# Patient Record
Sex: Female | Born: 1950 | Race: White | Hispanic: No | Marital: Single | Smoking: Never smoker
Health system: Southern US, Community
[De-identification: ages and names within clinical notes are randomized; demographics above are authoritative.]

## PROBLEM LIST (undated history)

## (undated) DIAGNOSIS — K859 Acute pancreatitis without necrosis or infection, unspecified: Secondary | ICD-10-CM

## (undated) DIAGNOSIS — M1990 Unspecified osteoarthritis, unspecified site: Secondary | ICD-10-CM

## (undated) DIAGNOSIS — I10 Essential (primary) hypertension: Secondary | ICD-10-CM

---

## 1997-09-20 ENCOUNTER — Other Ambulatory Visit: Admission: RE | Admit: 1997-09-20 | Discharge: 1997-09-20 | Payer: Self-pay | Admitting: Obstetrics and Gynecology

## 1999-07-19 ENCOUNTER — Other Ambulatory Visit: Admission: RE | Admit: 1999-07-19 | Discharge: 1999-07-19 | Payer: Self-pay | Admitting: Obstetrics and Gynecology

## 2005-02-04 ENCOUNTER — Emergency Department (HOSPITAL_COMMUNITY): Admission: EM | Admit: 2005-02-04 | Discharge: 2005-02-04 | Payer: Self-pay | Admitting: Emergency Medicine

## 2011-02-26 DIAGNOSIS — K859 Acute pancreatitis without necrosis or infection, unspecified: Secondary | ICD-10-CM

## 2011-02-26 HISTORY — DX: Acute pancreatitis without necrosis or infection, unspecified: K85.90

## 2012-03-31 ENCOUNTER — Emergency Department (HOSPITAL_COMMUNITY): Payer: Self-pay

## 2012-03-31 ENCOUNTER — Emergency Department (HOSPITAL_COMMUNITY)
Admission: EM | Admit: 2012-03-31 | Discharge: 2012-03-31 | Disposition: A | Discharge status: Home / Self Care | Payer: Self-pay | Attending: Emergency Medicine | Admitting: Emergency Medicine

## 2012-03-31 ENCOUNTER — Encounter (HOSPITAL_COMMUNITY): Payer: Self-pay | Admitting: Neurology

## 2012-03-31 DIAGNOSIS — J40 Bronchitis, not specified as acute or chronic: Secondary | ICD-10-CM | POA: Insufficient documentation

## 2012-03-31 DIAGNOSIS — I10 Essential (primary) hypertension: Secondary | ICD-10-CM | POA: Insufficient documentation

## 2012-03-31 DIAGNOSIS — R5381 Other malaise: Secondary | ICD-10-CM | POA: Insufficient documentation

## 2012-03-31 DIAGNOSIS — R509 Fever, unspecified: Secondary | ICD-10-CM | POA: Insufficient documentation

## 2012-03-31 HISTORY — DX: Essential (primary) hypertension: I10

## 2012-03-31 MED ORDER — PREDNISONE 20 MG PO TABS
60.0000 mg | ORAL_TABLET | ORAL | Status: AC
  Administered 2012-03-31: 60 mg via ORAL
  Filled 2012-03-31: qty 3

## 2012-03-31 MED ORDER — HYDROCOD POLST-CHLORPHEN POLST 10-8 MG/5ML PO LQCR: 5.0000 mL | Freq: Once | ORAL | Status: DC

## 2012-03-31 MED ORDER — PREDNISONE 20 MG PO TABS: 60.0000 mg | ORAL_TABLET | Freq: Every day | ORAL | Status: AC

## 2012-03-31 NOTE — ED Provider Notes (Signed)
History     CSN: [id number]  Arrival date & time 03/31/12  0744   First MD Initiated Contact with Patient 03/31/12 [phone]      No chief complaint on file.    HPI  The patient presents with ongoing cough, subjective fever, fatigue.  Symptoms began approximately one week ago, without clear precipitant.  Since onset symptoms have been persistent, with some relief with ibuprofen.  She denies concurrent objective fever, confusion, disorientation, chest pain, vomiting, diarrhea. Symptoms include persistent hacking cough productive of sputum. The patient is homeless, but lives in a shelter currently. She does not smoke, does not drink.   Past Medical History  Diagnosis Date  . Hypertension     History reviewed. No pertinent past surgical history.  No family history on file.  History  Substance Use Topics  . Smoking status: Never Smoker   . Smokeless tobacco: Not on file  . Alcohol Use: Yes    OB History    Grav Para Term Preterm Abortions TAB SAB Ect Mult Living                  Review of Systems  Constitutional:       Per HPI, otherwise negative  HENT:       Per HPI, otherwise negative  Eyes: Negative.   Respiratory: Positive for cough. Negative for apnea, choking, chest tightness, shortness of breath and wheezing.   Cardiovascular:       Per HPI, otherwise negative  Gastrointestinal: Negative for nausea and vomiting.  Genitourinary: Negative.   Musculoskeletal:       Per HPI, otherwise negative  Skin: Negative.   Neurological: Negative for syncope.    Allergies  Lisinopril  Home Medications  No current outpatient prescriptions on file.  BP 158/81  Pulse 83  Temp 98.1 F (36.7 C) (Oral)  Resp 20  Ht 5' 3.5" (1.613 m)  Wt 195 lb (88.451 kg)  BMI 34.00 kg/m2  SpO2 96%  Physical Exam  Nursing note and vitals reviewed. Constitutional: She is oriented to person, place, and time. She appears well-developed and well-nourished. No distress.  HENT:  Head:  Normocephalic and atraumatic.  Eyes: Conjunctivae normal and EOM are normal.  Cardiovascular: Normal rate and regular rhythm.   Pulmonary/Chest: Effort normal and breath sounds normal. No stridor. No respiratory distress.  Abdominal: She exhibits no distension.  Musculoskeletal: She exhibits no edema.  Neurological: She is alert and oriented to person, place, and time. No cranial nerve deficit.  Skin: Skin is warm and dry.  Psychiatric: She has a normal mood and affect.    ED Course  Procedures (including critical care time)  Labs Reviewed - No data to display No results found.   No diagnosis found.  Update: Patient resting, seemingly comfortable. We discussed findings this far, the need for medication compliance, honey for antitussive.  The patient states that she has a clinic for followup.  MDM  This homeless female, living in a shelter now presents with ongoing cough.  Given her social situation, x-ray was performed to rule out pneumonia.  This was negative, but demonstrates bronchitis.  On exam the patient was in no distress.  She started on a short course of steroids.  Given a history of intolerance for antitussives, honey was advised.  She will follow up with clinic as needed.        Gerhard Munch, MD 03/31/12 [phone]

## 2012-03-31 NOTE — ED Notes (Signed)
Patient transported to X-ray 

## 2012-03-31 NOTE — ED Notes (Signed)
Pt states productive cough, fevers, lethargy. Feeling tired x 5 days.a & o x 4. Nad

## 2012-03-31 NOTE — ED Notes (Signed)
Pt unable to swallow pills well. Pt took 40 mg prednisone. Began gagging and coughed up 3rd 20 mg prednisone.

## 2012-04-06 ENCOUNTER — Encounter (HOSPITAL_COMMUNITY): Payer: Self-pay | Admitting: *Deleted

## 2012-04-06 ENCOUNTER — Emergency Department (HOSPITAL_COMMUNITY)
Admission: EM | Admit: 2012-04-06 | Discharge: 2012-04-06 | Disposition: A | Discharge status: Home / Self Care | Payer: Self-pay | Attending: Emergency Medicine | Admitting: Emergency Medicine

## 2012-04-06 ENCOUNTER — Emergency Department (HOSPITAL_COMMUNITY): Payer: Self-pay

## 2012-04-06 DIAGNOSIS — R05 Cough: Secondary | ICD-10-CM | POA: Insufficient documentation

## 2012-04-06 DIAGNOSIS — Z79899 Other long term (current) drug therapy: Secondary | ICD-10-CM | POA: Insufficient documentation

## 2012-04-06 DIAGNOSIS — I10 Essential (primary) hypertension: Secondary | ICD-10-CM | POA: Insufficient documentation

## 2012-04-06 DIAGNOSIS — IMO0002 Reserved for concepts with insufficient information to code with codable children: Secondary | ICD-10-CM | POA: Insufficient documentation

## 2012-04-06 DIAGNOSIS — J069 Acute upper respiratory infection, unspecified: Secondary | ICD-10-CM | POA: Insufficient documentation

## 2012-04-06 DIAGNOSIS — R059 Cough, unspecified: Secondary | ICD-10-CM | POA: Insufficient documentation

## 2012-04-06 MED ORDER — ALBUTEROL SULFATE HFA 108 (90 BASE) MCG/ACT IN AERS: INHALATION_SPRAY | RESPIRATORY_TRACT | Status: DC

## 2012-04-06 MED ORDER — BENZONATATE 100 MG PO CAPS: 100.0000 mg | ORAL_CAPSULE | Freq: Three times a day (TID) | ORAL | Status: DC | PRN

## 2012-04-06 NOTE — ED Notes (Signed)
Pt was seen here a week ago for cough, chill, congestion. Pt reports that even after taking medications prescribed here she does not feel much better. Pt reports continued cough and congestion.

## 2012-04-06 NOTE — ED Provider Notes (Signed)
History     CSN: [id number]  Arrival date & time 04/06/12  0740   First MD Initiated Contact with Patient 04/06/12 0745      Chief Complaint  Patient presents with  . URI     HPI Pt was seen at 0800.   Per pt, c/o gradual onset and persistence of constant ears congestion, runny/stuffy nose, sinus congestion, and cough for the past 2 weeks.  States cough is worse when she lays down at night.  Pt was eval in the ED 1 week ago for same, dx bronchitis, rx prednisone with partial relief of symptoms.  Denies fevers, no rash, no CP/SOB, no N/V/D, no abd pain.    Past Medical History  Diagnosis Date  . Hypertension     History reviewed. No pertinent past surgical history.    History  Substance Use Topics  . Smoking status: Never Smoker   . Smokeless tobacco: Not on file  . Alcohol Use: Yes    Review of Systems ROS: Statement: All systems negative except as marked or noted in the HPI; Constitutional: Negative for fever and chills. ; ; Eyes: Negative for eye pain, redness and discharge. ; ; ENMT: Negative for ear pain, hoarseness, sore throat. +ears congestion, nasal congestion, sinus pressure. ; ; Cardiovascular: Negative for chest pain, palpitations, diaphoresis, dyspnea and peripheral edema. ; ; Respiratory: +cough. Negative for wheezing and stridor. ; ; Gastrointestinal: Negative for nausea, vomiting, diarrhea, abdominal pain, blood in stool, hematemesis, jaundice and rectal bleeding. . ; ; Genitourinary: Negative for dysuria, flank pain and hematuria. ; ; Musculoskeletal: Negative for back pain and neck pain. Negative for swelling and trauma.; ; Skin: Negative for pruritus, rash, abrasions, blisters, bruising and skin lesion.; ; Neuro: Negative for headache, lightheadedness and neck stiffness. Negative for weakness, altered level of consciousness , altered mental status, extremity weakness, paresthesias, involuntary movement, seizure and syncope.       Allergies  Erythromycin;  Lisinopril; and Other  Home Medications   Current Outpatient Rx  Name  Route  Sig  Dispense  Refill  . hydrochlorothiazide (HYDRODIURIL) 25 MG tablet   Oral   Take 25 mg by mouth daily.         . predniSONE (DELTASONE) 20 MG tablet   Oral   Take 20 mg by mouth daily.         Marland Kitchen albuterol (PROVENTIL HFA;VENTOLIN HFA) 108 (90 BASE) MCG/ACT inhaler      2 to 4 puffs every 4 hours for the next 7 days, then as needed for cough, wheezing.   1 Inhaler   0   . benzonatate (TESSALON) 100 MG capsule   Oral   Take 1 capsule (100 mg total) by mouth 3 (three) times daily as needed for cough.   15 capsule   0     BP 145/70  Pulse 91  Temp(Src) 98.3 F (36.8 C) (Oral)  SpO2 97%  Physical Exam 0805: Physical examination:  Nursing notes reviewed; Vital signs and O2 SAT reviewed;  Constitutional: Well developed, Well nourished, Well hydrated, In no acute distress; Head:  Normocephalic, atraumatic; Eyes: EOMI, PERRL, No scleral icterus; ENMT: TM's clear bilat. +edemetous nasal turbinates bilat with clear rhinorrhea.  Mouth and pharynx normal, Mucous membranes moist; Neck: Supple, Full range of motion, No lymphadenopathy; Cardiovascular: Regular rate and rhythm, No murmur, rub, or gallop; Respiratory: Breath sounds clear & equal bilaterally, No rales, rhonchi, wheezes.  Speaking full sentences with ease, Normal respiratory effort/excursion; Chest: Nontender, Movement normal;  Abdomen: Soft, Nontender, Nondistended, Normal bowel sounds; Genitourinary: No CVA tenderness; Extremities: Pulses normal, No tenderness, No edema, No calf edema or asymmetry.; Neuro: AA&Ox3, Major CN grossly intact.  Speech clear. Climbs on and off stretcher with ease by herself. Gait steady. No gross focal motor or sensory deficits in extremities.; Skin: Color normal, Warm, Dry.   ED Course  Procedures    MDM  MDM Reviewed: nursing note, vitals and previous chart Interpretation: x-ray   Dg Chest 2  View 04/06/2012  *RADIOLOGY REPORT*  Clinical Data: Cough.  CHEST - 2 VIEW  Comparison: 03/31/2012  Findings: Heart size is at the upper limits of normal in stable. Somewhat low lung volumes are noted, however both lungs are clear. No evidence of pleural effusion.  No mass or lymphadenopathy identified.  The  IMPRESSION: Low lung volumes.  No active disease.   Original Report Authenticated By: Myles Rosenthal, M.D.     [phone]:  No pneumonia on CXR.  Will tx symptomatically at this time. Dx and testing d/w pt.  Questions answered.  Verb understanding, agreeable to d/c home with outpt f/u.      Laray Anger, DO 04/08/12 2043

## 2012-06-18 ENCOUNTER — Emergency Department (HOSPITAL_COMMUNITY)
Admission: EM | Admit: 2012-06-18 | Discharge: 2012-06-18 | Disposition: A | Discharge status: Home / Self Care | Payer: Self-pay | Attending: Emergency Medicine | Admitting: Emergency Medicine

## 2012-06-18 ENCOUNTER — Encounter (HOSPITAL_COMMUNITY): Payer: Self-pay | Admitting: Cardiology

## 2012-06-18 DIAGNOSIS — IMO0002 Reserved for concepts with insufficient information to code with codable children: Secondary | ICD-10-CM | POA: Insufficient documentation

## 2012-06-18 DIAGNOSIS — R5381 Other malaise: Secondary | ICD-10-CM | POA: Insufficient documentation

## 2012-06-18 DIAGNOSIS — N390 Urinary tract infection, site not specified: Secondary | ICD-10-CM

## 2012-06-18 DIAGNOSIS — Z8719 Personal history of other diseases of the digestive system: Secondary | ICD-10-CM | POA: Insufficient documentation

## 2012-06-18 DIAGNOSIS — Z79899 Other long term (current) drug therapy: Secondary | ICD-10-CM | POA: Insufficient documentation

## 2012-06-18 DIAGNOSIS — R0602 Shortness of breath: Secondary | ICD-10-CM | POA: Insufficient documentation

## 2012-06-18 DIAGNOSIS — E876 Hypokalemia: Secondary | ICD-10-CM

## 2012-06-18 DIAGNOSIS — I10 Essential (primary) hypertension: Secondary | ICD-10-CM | POA: Insufficient documentation

## 2012-06-18 DIAGNOSIS — R6883 Chills (without fever): Secondary | ICD-10-CM | POA: Insufficient documentation

## 2012-06-18 HISTORY — DX: Acute pancreatitis without necrosis or infection, unspecified: K85.90

## 2012-06-18 LAB — COMPREHENSIVE METABOLIC PANEL
Albumin: 2.9 g/dL — ABNORMAL LOW (ref 3.5–5.2)
Alkaline Phosphatase: 100 U/L (ref 39–117)
BUN: 10 mg/dL (ref 6–23)
Creatinine, Ser: 0.74 mg/dL (ref 0.50–1.10)
GFR calc Af Amer: >90 mL/min (ref >90)
Glucose, Bld: 135 mg/dL — ABNORMAL HIGH (ref 70–99)
Total Bilirubin: 0.6 mg/dL (ref 0.3–1.2)
Total Protein: 7.9 g/dL (ref 6.0–8.3)

## 2012-06-18 LAB — LIPASE, BLOOD: Lipase: 24 U/L (ref 11–59)

## 2012-06-18 LAB — URINALYSIS, ROUTINE W REFLEX MICROSCOPIC
Ketones, ur: 40 mg/dL — AB
Nitrite: POSITIVE — AB
Specific Gravity, Urine: 1.025 (ref 1.005–1.030)
Urobilinogen, UA: 2 mg/dL — ABNORMAL HIGH (ref 0.0–1.0)
pH: 6 (ref 5.0–8.0)

## 2012-06-18 LAB — CBC WITH DIFFERENTIAL/PLATELET
Basophils Relative: 0 % (ref 0–1)
Eosinophils Absolute: 0 10*3/uL (ref 0.0–0.7)
HCT: 41.2 % (ref 36.0–46.0)
Hemoglobin: 14.2 g/dL (ref 12.0–15.0)
Lymphs Abs: 1.6 10*3/uL (ref 0.7–4.0)
MCH: 28.3 pg (ref 26.0–34.0)
MCHC: 34.5 g/dL (ref 30.0–36.0)
MCV: 82.2 fL (ref 78.0–100.0)
Monocytes Absolute: 1.1 10*3/uL — ABNORMAL HIGH (ref 0.1–1.0)
Monocytes Relative: 11 % (ref 3–12)
RBC: 5.01 MIL/uL (ref 3.87–5.11)

## 2012-06-18 LAB — URINE MICROSCOPIC-ADD ON

## 2012-06-18 MED ORDER — SODIUM CHLORIDE 0.9 % IV SOLN
Freq: Once | INTRAVENOUS | Status: AC
  Administered 2012-06-18: 13:00:00 via INTRAVENOUS

## 2012-06-18 MED ORDER — POTASSIUM CHLORIDE CRYS ER 20 MEQ PO TBCR
40.0000 meq | EXTENDED_RELEASE_TABLET | Freq: Once | ORAL | Status: DC
  Filled 2012-06-18: qty 2

## 2012-06-18 MED ORDER — DEXTROSE 5 % IV SOLN
1.0000 g | Freq: Once | INTRAVENOUS | Status: AC
  Administered 2012-06-18: 1 g via INTRAVENOUS
  Filled 2012-06-18: qty 10

## 2012-06-18 MED ORDER — PROMETHAZINE HCL 12.5 MG PO TABS: 12.5000 mg | ORAL_TABLET | Freq: Four times a day (QID) | ORAL | Status: DC | PRN

## 2012-06-18 MED ORDER — SODIUM CHLORIDE 0.9 % IV BOLUS (SEPSIS)
500.0000 mL | Freq: Once | INTRAVENOUS | Status: AC
  Administered 2012-06-18: 500 mL via INTRAVENOUS

## 2012-06-18 MED ORDER — CEPHALEXIN 500 MG PO CAPS: 500.0000 mg | ORAL_CAPSULE | Freq: Two times a day (BID) | ORAL | Status: DC

## 2012-06-18 MED ORDER — ACETAMINOPHEN 160 MG/5ML PO SOLN
650.0000 mg | Freq: Four times a day (QID) | ORAL | Status: DC | PRN
  Administered 2012-06-18: 650 mg via ORAL
  Filled 2012-06-18: qty 20.3

## 2012-06-18 MED ORDER — ONDANSETRON HCL 4 MG/2ML IJ SOLN
4.0000 mg | Freq: Once | INTRAMUSCULAR | Status: AC
  Administered 2012-06-18: 4 mg via INTRAVENOUS
  Filled 2012-06-18: qty 2

## 2012-06-18 MED ORDER — POTASSIUM CHLORIDE 20 MEQ/15ML (10%) PO LIQD
40.0000 meq | Freq: Once | ORAL | Status: AC
  Administered 2012-06-18: 40 meq via ORAL
  Filled 2012-06-18: qty 30

## 2012-06-18 NOTE — ED Notes (Signed)
Pt reports intermittent nausea and malaise over the past couple of days. States she has not had much to eat or drink. States she is hungry but can not eat. Denies any diarrhea or urinary symptoms. Hx of pancreatitis

## 2012-06-18 NOTE — ED Provider Notes (Signed)
History     CSN: [id number]  Arrival date & time 06/18/12  1054   First MD Initiated Contact with Patient 06/18/12 1112      Chief Complaint  Patient presents with  . Nausea    (Consider location/radiation/quality/duration/timing/severity/associated sxs/prior treatment) HPI [name] is a 62 y.o. female who presents to ED with complaint of nausea. States symptoms began 2 days ago. States unable to eat due to nausea, eating or drinking makes it worse. Denies any abdominal pain or back pain. Denies any fever, admits to chills, no vomiting, diarrhea. No urinary symptoms. Has not tried anything to make symptoms better. Admits to shortness of breath and fatigue for same period of time.  Denies chest pain.    Past Medical History  Diagnosis Date  . Hypertension   . Pancreatitis     History reviewed. No pertinent past surgical history.  History reviewed. No pertinent family history.  History  Substance Use Topics  . Smoking status: Never Smoker   . Smokeless tobacco: Not on file  . Alcohol Use: Yes    OB History   Grav Para Term Preterm Abortions TAB SAB Ect Mult Living                  Review of Systems  Constitutional: Positive for chills and fatigue. Negative for fever.  HENT: Negative for neck pain and neck stiffness.   Respiratory: Positive for shortness of breath. Negative for cough and chest tightness.   Cardiovascular: Negative.   Gastrointestinal: Positive for nausea. Negative for vomiting, abdominal pain, diarrhea and blood in stool.  Genitourinary: Negative.   Musculoskeletal: Negative for myalgias.  Skin: Negative.   Neurological: Negative for weakness and numbness.  All other systems reviewed and are negative.    Allergies  Erythromycin; Lisinopril; and Other  Home Medications   Current Outpatient Rx  Name  Route  Sig  Dispense  Refill  . albuterol (PROVENTIL HFA;VENTOLIN HFA) 108 (90 BASE) MCG/ACT inhaler      2 to 4 puffs every 4 hours for  the next 7 days, then as needed for cough, wheezing.   1 Inhaler   0   . benzonatate (TESSALON) 100 MG capsule   Oral   Take 1 capsule (100 mg total) by mouth 3 (three) times daily as needed for cough.   15 capsule   0   . hydrochlorothiazide (HYDRODIURIL) 25 MG tablet   Oral   Take 25 mg by mouth daily.         . predniSONE (DELTASONE) 20 MG tablet   Oral   Take 20 mg by mouth daily.           BP 121/66  Pulse 86  Temp(Src) 98.3 F (36.8 C) (Oral)  Resp 24  SpO2 97%  Physical Exam  Nursing note and vitals reviewed. Constitutional: She is oriented to person, place, and time. She appears well-developed and well-nourished.  Uncomfortable appearing  HENT:  Head: Normocephalic.  Oral mucosa dry  Eyes: Conjunctivae are normal.  Neck: Neck supple.  Cardiovascular: Normal rate, regular rhythm and normal heart sounds.   Pulmonary/Chest: Effort normal and breath sounds normal. No respiratory distress. She has no wheezes. She has no rales.  Abdominal: Soft. Bowel sounds are normal. She exhibits no distension. There is no tenderness. There is no rebound and no guarding.  No CVA tenderness  Musculoskeletal: She exhibits no edema.  Neurological: She is alert and oriented to person, place, and time.  Skin: Skin  is warm and dry.  Psychiatric:  Pt anxious appearing    ED Course  Procedures (including critical care time)  Pt seen and examined. Pt with nausea, generalized malaise and weakness. Afebrile here. VS normal. Will get labs, UA, fluids started, zofran ordered. Pt is not in any pain.   Results for orders placed during the hospital encounter of 06/18/12  CBC WITH DIFFERENTIAL      Result Value Range   WBC 10.1  4.0 - 10.5 K/uL   RBC 5.01  3.87 - 5.11 MIL/uL   Hemoglobin 14.2  12.0 - 15.0 g/dL   HCT 16.1  09.6 - 04.5 %   MCV 82.2  78.0 - 100.0 fL   MCH 28.3  26.0 - 34.0 pg   MCHC 34.5  30.0 - 36.0 g/dL   RDW 40.9  81.1 - 91.4 %   Platelets 408 (*) 150 - 400 K/uL    Neutrophils Relative 73  43 - 77 %   Neutro Abs 7.3  1.7 - 7.7 K/uL   Lymphocytes Relative 16  12 - 46 %   Lymphs Abs 1.6  0.7 - 4.0 K/uL   Monocytes Relative 11  3 - 12 %   Monocytes Absolute 1.1 (*) 0.1 - 1.0 K/uL   Eosinophils Relative 0  0 - 5 %   Eosinophils Absolute 0.0  0.0 - 0.7 K/uL   Basophils Relative 0  0 - 1 %   Basophils Absolute 0.0  0.0 - 0.1 K/uL  COMPREHENSIVE METABOLIC PANEL      Result Value Range   Sodium 133 (*) 135 - 145 mEq/L   Potassium 3.2 (*) 3.5 - 5.1 mEq/L   Chloride 94 (*) 96 - 112 mEq/L   CO2 25  19 - 32 mEq/L   Glucose, Bld 135 (*) 70 - 99 mg/dL   BUN 10  6 - 23 mg/dL   Creatinine, Ser 7.82  0.50 - 1.10 mg/dL   Calcium 9.1  8.4 - 95.6 mg/dL   Total Protein 7.9  6.0 - 8.3 g/dL   Albumin 2.9 (*) 3.5 - 5.2 g/dL   AST 21  0 - 37 U/L   ALT 20  0 - 35 U/L   Alkaline Phosphatase 100  39 - 117 U/L   Total Bilirubin 0.6  0.3 - 1.2 mg/dL   GFR calc non Af Amer >90  >90 mL/min   GFR calc Af Amer >90  >90 mL/min  LIPASE, BLOOD      Result Value Range   Lipase 24  11 - 59 U/L  URINALYSIS, ROUTINE W REFLEX MICROSCOPIC      Result Value Range   Color, Urine YELLOW  YELLOW   APPearance HAZY (*) CLEAR   Specific Gravity, Urine 1.025  1.005 - 1.030   pH 6.0  5.0 - 8.0   Glucose, UA 100 (*) NEGATIVE mg/dL   Hgb urine dipstick SMALL (*) NEGATIVE   Bilirubin Urine MODERATE (*) NEGATIVE   Ketones, ur 40 (*) NEGATIVE mg/dL   Protein, ur 213 (*) NEGATIVE mg/dL   Urobilinogen, UA 2.0 (*) 0.0 - 1.0 mg/dL   Nitrite POSITIVE (*) NEGATIVE   Leukocytes, UA TRACE (*) NEGATIVE  URINE MICROSCOPIC-ADD ON      Result Value Range   Squamous Epithelial / LPF MANY (*) RARE   WBC, UA 0-2  <3 WBC/hpf   RBC / HPF 0-2  <3 RBC/hpf   Bacteria, UA MANY (*) RARE   Urine-Other LESS THAN 10 mL  OF URINE SUBMITTED      Date: 06/18/2012  Rate: 81  Rhythm: normal sinus rhythm  QRS Axis: normal  Intervals: normal  ST/T Wave abnormalities: normal  Conduction  Disutrbances:none  Narrative Interpretation:   Old EKG Reviewed: none available    2:17 PM Urine contaminated, however does appear infected due to positive nitrite and leukocytes, rocephin 1g IV given. She did spike a low grade fever of 100.3 orally, tylenol given for that. Labs unremarkable, except for mildly low sodium of 133, and potassium of 3.2. Potassium replaced with PO.  Her ECG is normal. Pt's nausea resolved. She is drinking PO fluids in ED. Suspect her symptoms most likely related to UTI vs early pyelonephritis.     1. UTI (lower urinary tract infection)   2. Hypokalemia       MDM  Pt with nausea, chills at home. No pain. No vomiting or diarrhea. No URI symptoms. No cough. No chest pain. Her lab work unremarkable. She was rehydrated with IV fluids. zofran given for nausea IV. She felt better. Nausea resolved. UA back showing contamination but most likely infected with positive nitrites and leukocyte esterase, many bacteria. Cultures sent. Treated in ED with 1g rocephin IV. Pt 's vs continued to be normal. No signs of pyelonephritis at this time, no evidence of sepsis. Home with tylenol for fever, keflex, phenergan for nausea.  Pt instructed to follow up closely with PCP or return if worsening.   Filed Vitals:   06/18/12 1108 06/18/12 1312 06/18/12 1415 06/18/12 1422  BP: 121/66 139/45 118/66   Pulse: 86 84 85   Temp: 98.3 F (36.8 C) 100.3 F (37.9 C)  99.4 F (37.4 C)  TempSrc: Oral Oral  Oral  Resp: 24 20 23    SpO2: 97% 100% 95%              Maaz Spiering A Kodah Maret, PA-C 06/18/12 1428

## 2012-06-19 LAB — URINE CULTURE: Colony Count: 50000

## 2012-06-19 NOTE — ED Provider Notes (Signed)
  Medical screening examination/treatment/procedure(s) were performed by non-physician practitioner and as supervising physician I was immediately available for consultation/collaboration.    Eshawn Coor D Rolonda Pontarelli, MD 06/19/12 0754 

## 2014-03-24 ENCOUNTER — Encounter (HOSPITAL_COMMUNITY): Payer: Self-pay | Admitting: Emergency Medicine

## 2014-03-24 ENCOUNTER — Emergency Department (HOSPITAL_COMMUNITY)
Admission: EM | Admit: 2014-03-24 | Discharge: 2014-03-24 | Disposition: A | Discharge status: Home / Self Care | Payer: Self-pay | Attending: Emergency Medicine | Admitting: Emergency Medicine

## 2014-03-24 DIAGNOSIS — Z760 Encounter for issue of repeat prescription: Secondary | ICD-10-CM

## 2014-03-24 DIAGNOSIS — Z8719 Personal history of other diseases of the digestive system: Secondary | ICD-10-CM | POA: Insufficient documentation

## 2014-03-24 DIAGNOSIS — I10 Essential (primary) hypertension: Secondary | ICD-10-CM | POA: Insufficient documentation

## 2014-03-24 DIAGNOSIS — Z792 Long term (current) use of antibiotics: Secondary | ICD-10-CM | POA: Insufficient documentation

## 2014-03-24 DIAGNOSIS — Z79899 Other long term (current) drug therapy: Secondary | ICD-10-CM | POA: Insufficient documentation

## 2014-03-24 MED ORDER — AMLODIPINE BESYLATE 5 MG PO TABS: 5.0000 mg | ORAL_TABLET | Freq: Every day | ORAL | Status: AC

## 2014-03-24 MED ORDER — HYDROCHLOROTHIAZIDE 25 MG PO TABS: 25.0000 mg | ORAL_TABLET | Freq: Every day | ORAL | Status: AC

## 2014-03-24 NOTE — Discharge Instructions (Signed)
Medication Refill, Emergency Department °We have refilled your medication today as a courtesy to you. It is best for your medical care, however, to take care of getting refills done through your primary caregiver's office. They have your records and can do a better job of follow-up than we can in the emergency department. °On maintenance medications, we often only prescribe enough medications to get you by until you are able to see your regular caregiver. This is a more expensive way to refill medications. °In the future, please plan for refills so that you will not have to use the emergency department for this. °Thank you for your help. Your help allows us to better take care of the daily emergencies that enter our department. °Document Released: 05/31/2003 Document Revised: 05/06/2011 Document Reviewed: 05/21/2013 °[employer]® Patient Information ©2015 [employer]. This information is not intended to replace advice given to you by your health care provider. Make sure you discuss any questions you have with your health care provider. ° °

## 2014-03-24 NOTE — ED Provider Notes (Signed)
CSN: [id number]     Arrival date & time 03/24/14  1414 History  This chart was scribed for non-physician practitioner Domenic Moras, working with Debby Freiberg, MD by Donato Schultz, ED Scribe. This patient was seen in room WTR7/WTR7 and the patient's care was started at 3:07 PM.   Chief Complaint  Patient presents with  . Medication Refill   The history is provided by the patient. No language interpreter was used.   HPI Comments: Doris White is a 64 y.o. female who presents to the Emergency Department to refill her blood pressure medication.  She normally has her prescriptions refilled at the [facility] but since they changed providers, they will no longer refill her medications.  The social worker referred her to the ED for a refill and is in the process of finding her another provider.  She takes 25mg  HCTZ and 5mg  Amlodipine QD.  She ran out of her medication yesterday and is anxious about her blood pressure becoming elevated.  She denies chest pain and SOB as associated symptoms.   Past Medical History  Diagnosis Date  . Hypertension   . Pancreatitis    History reviewed. No pertinent past surgical history. No family history on file. History  Substance Use Topics  . Smoking status: Never Smoker   . Smokeless tobacco: Not on file  . Alcohol Use: Yes   OB History    No data available     Review of Systems  Respiratory: Negative for shortness of breath.   Cardiovascular: Negative for chest pain.  All other systems reviewed and are negative.   Allergies  Erythromycin; Lisinopril; and Other  Home Medications   Prior to Admission medications   Medication Sig Start Date End Date Taking? Authorizing Provider  cephALEXin (KEFLEX) 500 MG capsule Take 1 capsule (500 mg total) by mouth 2 (two) times daily. 06/18/12   Tatyana A Kirichenko, PA-C  hydrochlorothiazide (HYDRODIURIL) 25 MG tablet Take 12.5 mg by mouth daily.     Historical Provider, MD  promethazine (PHENERGAN) 12.5 MG tablet  Take 1 tablet (12.5 mg total) by mouth every 6 (six) hours as needed for nausea. 06/18/12   Tatyana A Kirichenko, PA-C   Triage Vitals: BP 140/67 mmHg  Pulse 84  Temp(Src) 98.1 F (36.7 C) (Oral)  Resp 20  SpO2 97%  Physical Exam  Constitutional: She is oriented to person, place, and time. She appears well-developed and well-nourished.  HENT:  Head: Normocephalic and atraumatic.  Eyes: EOM are normal.  Neck: Normal range of motion.  Cardiovascular: Normal rate, regular rhythm and normal heart sounds.  Exam reveals no gallop and no friction rub.   No murmur heard. Pulmonary/Chest: Effort normal and breath sounds normal. No respiratory distress. She has no wheezes. She has no rales.  Musculoskeletal: Normal range of motion.  Neurological: She is alert and oriented to person, place, and time.  Skin: Skin is warm and dry.  Psychiatric: She has a normal mood and affect. Her behavior is normal.  Nursing note and vitals reviewed.   ED Course  Procedures (including critical care time)  DIAGNOSTIC STUDIES: Oxygen Saturation is 97% on room air, adequate by my interpretation.    COORDINATION OF CARE: 3:09 PM- Will refill the patient's blood pressure medication. The patient agreed to the treatment plan.    Labs Review Labs Reviewed - No data to display  Imaging Review No results found.   EKG Interpretation None      MDM   Final diagnoses:  Medication refill    BP 140/67 mmHg  Pulse 84  Temp(Src) 98.1 F (36.7 C) (Oral)  Resp 20  SpO2 97%   I personally performed the services described in this documentation, which was scribed in my presence. The recorded information has been reviewed and is accurate.     Domenic Moras, PA-C 03/24/14 [place], MD 03/25/14 [phone]

## 2014-03-24 NOTE — ED Notes (Signed)
Pt states that she was being seen at [facility] but since they have changed providers they will no longer refill her BP meds, she has been out since Tues/Wed.  Pt has gotten a Education officer, museum to help her find a new PCP, but told pt to come to ED to get her medications prescription and she will help pt get them filled.  Pt states that she is very anxious due to not being able to do.

## 2014-03-24 NOTE — Progress Notes (Signed)
CARE MANAGEMENT ED NOTE 03/24/2014  Patient:  BAYLEIGH, LOFLIN A   Account Number:  [id number]  Date Initiated:  03/24/2014  Documentation initiated by:  Jackelyn Poling  Subjective/Objective Assessment:   64 yr old self pay [place] pt was being seen at [facility] but since they have changed providers they will no longer refill her BP meds, she has been out since Tues/Wed.  Pt has gotten a Education officer, museum to help her find a new PCP, but told p     Subjective/Objective Assessment Detail:   pt to come to ED to get her medications prescription and she will help pt get them filled.  Pt states that she is very anxious due to not being able to do.     Pt told CM she was at [facility] on Tuesday  Pt confirmed her home address in [place]  This pt is not homeless.  States she was seen by Sharon Seller, NP at [facility] not a MD (pt gave Sharon Seller name from her last rx bottle  Pt told CM she was told to come to [facility] ED to get her medications for free.  Pt refused to attempt to allow CM to assist her with getting to another [facility] facility with a pharmacy to maybe honor her [facility] rx cost  Pt initially reluctant to give NP name at [facility] and to assist CM as Cm verified information in EPIC needed to place in PDMI system  Pt states she is attempting to get home before it gets dark and concerned because she states she has not had her bp med today "Scary" Pt moved from triage stretcher to bedside chair in room - anxious Pt raised the tone of her voice in throughout assessment then apologized to CM "for taking it out on you"  but voiced frustration on treatment of IRC "they never shared what you shared with me"  CM had informed pt that [facility] pt now assisted at [employer] and at [place] and pt may be able to get [facility] to assist her with an orange card that may help with a decreased cost for meds States she was only told she could not be assisted with meds at [facility] and [facility]  -eugene  Pt states she has $6 to get Rx filled but "may not have gas money later in week"     Action/Plan:   ED CM consulted by Triage RN for med assist to pt Stated pt "homeless" Spoke with pt discussed cost of hydrochlorothiazide $4 and Norvasc $7 at discounted pharmacies.  Assessed for homelessness. see notes below   Action/Plan Detail:   Anticipated DC Date:  03/24/2014     Status Recommendation to Physician:   Result of Recommendation:    Other ED Services  Consult Working [place]  Other  Outpatient Services - Pt will follow up  PCP issues  Medication Assistance  MATCH Program    Choice offered to / List presented to:            Status of service:  Completed, signed off  ED Comments:   ED Comments Detail:    CM reviewed EPIC notes and chart review information CM spoke with the pt about [facility] MATCH program ($3 co pay for each Rx through [facility] program, does not include refills, 7 day expiration of [place] letter and choice of pharmacies) Pt agreed to receive assistance from program  Pt is eligible for  [facility] MATCH program (unable to find pt listed in PDMI per cardholder name inquiry)  PDMI information entered. [place] letter completed and provided to pt.  CM updated ED RN  CM spoke with pt who confirms self pay [facility] resident with no pcp. CM discussed and provided written information for self pay pcps, importance of pcp for f/u care, www.needymeds.org, discounted pharmacies and other [employer] such as financial assistance, DSS and  health department  Reviewed resources for [employer] self pay pcps like Jinny Blossom, family medicine at [address], [facility] family practice, general medical clinics, [facility] urgent care plus others, [facility] out patient pharmacies, housing, and other resources in [employer]. Pt voiced understanding and appreciation of resources provided but initially stated she did not want resources CM encouraged pt again  to contact [facility] to see if she could get assistance with orange card and possible a facility to honor [facility] cost

## 2014-03-24 NOTE — ED Notes (Signed)
Waiting for case mgr

## 2014-03-24 NOTE — Progress Notes (Signed)
Dear _______Trudy A Jones__________________:  Dennis Bast have been approved to have the prescriptions written by your discharging physician filled through our [facility] (Medication Assistance Through [facility]) program. This program allows for a one-time (no refills) 34-day supply of selected medications for a low copay amount.  The copay is $3.00 per prescription. For instance, if you have one prescription, you will pay $3.00; for two prescriptions, you pay $6.00; for three prescriptions, you pay $9.00; and so on.  Only certain pharmacies are participating in this program with [facility]. You will need to select one of the pharmacies from the attached list and take your prescriptions, this letter, and your photo ID to one of the participating pharmacies.   We are excited that you are able to use the [facility] program to get your medications. These prescriptions must be filled within 7 days of hospital discharge or they will no longer be valid for the [facility] program. Should you have any problems with your prescriptions please contact your case management team member at [phone].  Thank you,   [place], [address] [place], [place], [place], [place], [place] and [place], [place], [place] [employer], [place], [employer], [place], [address] [employer], [place], [place], [place], [place], [place], [place], [place], [place], [place]   [facility] [address] [place], [place], [place], [place] [address] [place], [place], [place] [address], [place], [place], [place], [place], [place], [place] [address] [place], [place] [address], [place], [place], [place], [place], [place], [place] [address] [place], [place]   [place] [address] [place], [address] [employer] [address], [place], [place], [place], [place], [place], [place], [place]                                  [address], [place], [place], [place] [address], [place], [place], [place], [place], [place]  [place], [place], [place], [place] [address], [place], [place] [address], [place], [place] [employer], [place], [place] [address] [place], [place] [address] [place], [place]                                       [place], [place], [place] [employer], [place], [place]                                           [place], [place], [place], [place], [place], [place], [place], [place], [place], [place], [place], [place], [place]

## 2014-03-27 ENCOUNTER — Encounter (HOSPITAL_COMMUNITY): Payer: Self-pay | Admitting: Emergency Medicine

## 2014-03-27 ENCOUNTER — Emergency Department (HOSPITAL_COMMUNITY)
Admission: EM | Admit: 2014-03-27 | Discharge: 2014-03-27 | Disposition: A | Discharge status: Home / Self Care | Payer: Self-pay | Attending: Emergency Medicine | Admitting: Emergency Medicine

## 2014-03-27 DIAGNOSIS — I10 Essential (primary) hypertension: Secondary | ICD-10-CM | POA: Insufficient documentation

## 2014-03-27 DIAGNOSIS — X118XXA Contact with other hot tap-water, initial encounter: Secondary | ICD-10-CM | POA: Insufficient documentation

## 2014-03-27 DIAGNOSIS — Y9289 Other specified places as the place of occurrence of the external cause: Secondary | ICD-10-CM | POA: Insufficient documentation

## 2014-03-27 DIAGNOSIS — Y9389 Activity, other specified: Secondary | ICD-10-CM | POA: Insufficient documentation

## 2014-03-27 DIAGNOSIS — T23001A Burn of unspecified degree of right hand, unspecified site, initial encounter: Secondary | ICD-10-CM

## 2014-03-27 DIAGNOSIS — Y998 Other external cause status: Secondary | ICD-10-CM | POA: Insufficient documentation

## 2014-03-27 DIAGNOSIS — Z792 Long term (current) use of antibiotics: Secondary | ICD-10-CM | POA: Insufficient documentation

## 2014-03-27 DIAGNOSIS — Z8719 Personal history of other diseases of the digestive system: Secondary | ICD-10-CM | POA: Insufficient documentation

## 2014-03-27 DIAGNOSIS — T23201A Burn of second degree of right hand, unspecified site, initial encounter: Secondary | ICD-10-CM | POA: Insufficient documentation

## 2014-03-27 DIAGNOSIS — Z79899 Other long term (current) drug therapy: Secondary | ICD-10-CM | POA: Insufficient documentation

## 2014-03-27 DIAGNOSIS — T23031A Burn of unspecified degree of multiple right fingers (nail), not including thumb, initial encounter: Secondary | ICD-10-CM

## 2014-03-27 MED ORDER — HYDROCODONE-ACETAMINOPHEN 5-325 MG PO TABS: 2.0000 | ORAL_TABLET | ORAL | Status: DC | PRN

## 2014-03-27 MED ORDER — HYDROCODONE-ACETAMINOPHEN 5-325 MG PO TABS
2.0000 | ORAL_TABLET | Freq: Once | ORAL | Status: AC
  Administered 2014-03-27: 2 via ORAL
  Filled 2014-03-27: qty 2

## 2014-03-27 MED ORDER — SILVER SULFADIAZINE 1 % EX CREA
TOPICAL_CREAM | Freq: Two times a day (BID) | CUTANEOUS | Status: DC
  Administered 2014-03-27: 15:00:00 via TOPICAL
  Filled 2014-03-27: qty 50
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
## 2014-03-27 NOTE — Discharge Instructions (Signed)
Burn Care Your skin is a natural barrier to infection. It is the largest organ of your body. Burns damage this natural protection. To help prevent infection, it is very important to follow your caregiver's instructions in the care of your burn. Burns are classified as:  First degree. There is only redness of the skin (erythema). No scarring is expected.  Second degree. There is blistering of the skin. Scarring may occur with deeper burns.  Third degree. All layers of the skin are injured, and scarring is expected. HOME CARE INSTRUCTIONS   Wash your hands well before changing your bandage.  Change your bandage as often as directed by your caregiver.  Remove the old bandage. If the bandage sticks, you may soak it off with cool, clean water.  Cleanse the burn thoroughly but gently with mild soap and water.  Pat the area dry with a clean, dry cloth.  Apply a thin layer of antibacterial cream to the burn.  Apply a clean bandage as instructed by your caregiver.  Keep the bandage as clean and dry as possible.  Elevate the affected area for the first 24 hours, then as instructed by your caregiver.  Only take over-the-counter or prescription medicines for pain, discomfort, or fever as directed by your caregiver. SEEK IMMEDIATE MEDICAL CARE IF:   You develop excessive pain.  You develop redness, tenderness, swelling, or red streaks near the burn.  The burned area develops yellowish-white fluid (pus) or a bad smell.  You have a fever. MAKE SURE YOU:   Understand these instructions.  Will watch your condition.  Will get help right away if you are not doing well or get worse. Document Released: 02/11/2005 Document Revised: 05/06/2011 Document Reviewed: 07/04/2010 ExitCare Patient Information 2015 [employer]. This information is not intended to replace advice given to you by your health care provider. Make sure you discuss any questions you have with your health care  provider.  
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
## 2014-03-30 ENCOUNTER — Encounter (HOSPITAL_COMMUNITY): Payer: Self-pay | Admitting: Emergency Medicine

## 2014-03-30 ENCOUNTER — Emergency Department (INDEPENDENT_AMBULATORY_CARE_PROVIDER_SITE_OTHER)
Admission: EM | Admit: 2014-03-30 | Discharge: 2014-03-30 | Disposition: A | Discharge status: Home / Self Care | Payer: Self-pay | Source: Home / Self Care | Attending: Emergency Medicine | Admitting: Emergency Medicine

## 2014-03-30 DIAGNOSIS — IMO0002 Reserved for concepts with insufficient information to code with codable children: Secondary | ICD-10-CM

## 2014-03-30 DIAGNOSIS — T300 Burn of unspecified body region, unspecified degree: Secondary | ICD-10-CM

## 2014-03-30 MED ORDER — SILVER SULFADIAZINE 1 % EX CREA
1.0000 | TOPICAL_CREAM | Freq: Every day | CUTANEOUS | Status: DC
Start: 2014-03-30 — End: 2014-06-19

## 2014-03-30 MED ORDER — SILVER SULFADIAZINE 1 % EX CREA
TOPICAL_CREAM | CUTANEOUS | Status: AC
  Filled 2014-03-30: qty 85
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
## 2014-03-30 NOTE — Discharge Instructions (Signed)
Burn Care Your skin is a natural barrier to infection. It is the largest organ of your body. Burns damage this natural protection. To help prevent infection, it is very important to follow your caregiver's instructions in the care of your burn. Burns are classified as:  First degree. There is only redness of the skin (erythema). No scarring is expected.  Second degree. There is blistering of the skin. Scarring may occur with deeper burns.  Third degree. All layers of the skin are injured, and scarring is expected. HOME CARE INSTRUCTIONS   Wash your hands well before changing your bandage.  Change your bandage as often as directed by your caregiver.  Remove the old bandage. If the bandage sticks, you may soak it off with cool, clean water.  Cleanse the burn thoroughly but gently with mild soap and water.  Pat the area dry with a clean, dry cloth.  Apply a thin layer of antibacterial cream to the burn.  Apply a clean bandage as instructed by your caregiver.  Keep the bandage as clean and dry as possible.  Elevate the affected area for the first 24 hours, then as instructed by your caregiver.  Only take over-the-counter or prescription medicines for pain, discomfort, or fever as directed by your caregiver. SEEK IMMEDIATE MEDICAL CARE IF:   You develop excessive pain.  You develop redness, tenderness, swelling, or red streaks near the burn.  The burned area develops yellowish-white fluid (pus) or a bad smell.  You have a fever. MAKE SURE YOU:   Understand these instructions.  Will watch your condition.  Will get help right away if you are not doing well or get worse. Document Released: 02/11/2005 Document Revised: 05/06/2011 Document Reviewed: 07/04/2010 ExitCare Patient Information 2015 [employer]. This information is not intended to replace advice given to you by your health care provider. Make sure you discuss any questions you have with your health care  provider.  

## 2014-04-28 ENCOUNTER — Ambulatory Visit: Payer: Self-pay | Admitting: Internal Medicine

## 2014-05-10 ENCOUNTER — Ambulatory Visit: Payer: Self-pay

## 2014-06-19 ENCOUNTER — Emergency Department (HOSPITAL_COMMUNITY)
Admission: EM | Admit: 2014-06-19 | Discharge: 2014-06-19 | Disposition: A | Discharge status: Home / Self Care | Payer: Self-pay | Attending: Emergency Medicine | Admitting: Emergency Medicine

## 2014-06-19 ENCOUNTER — Encounter (HOSPITAL_COMMUNITY): Payer: Self-pay | Admitting: Emergency Medicine

## 2014-06-19 ENCOUNTER — Emergency Department (HOSPITAL_COMMUNITY): Payer: Self-pay

## 2014-06-19 DIAGNOSIS — I10 Essential (primary) hypertension: Secondary | ICD-10-CM | POA: Insufficient documentation

## 2014-06-19 DIAGNOSIS — Z79899 Other long term (current) drug therapy: Secondary | ICD-10-CM | POA: Insufficient documentation

## 2014-06-19 DIAGNOSIS — R059 Cough, unspecified: Secondary | ICD-10-CM

## 2014-06-19 DIAGNOSIS — E669 Obesity, unspecified: Secondary | ICD-10-CM | POA: Insufficient documentation

## 2014-06-19 DIAGNOSIS — R509 Fever, unspecified: Secondary | ICD-10-CM | POA: Insufficient documentation

## 2014-06-19 DIAGNOSIS — M5416 Radiculopathy, lumbar region: Secondary | ICD-10-CM | POA: Insufficient documentation

## 2014-06-19 DIAGNOSIS — R05 Cough: Secondary | ICD-10-CM | POA: Insufficient documentation

## 2014-06-19 DIAGNOSIS — Z8719 Personal history of other diseases of the digestive system: Secondary | ICD-10-CM | POA: Insufficient documentation

## 2014-06-19 LAB — I-STAT CHEM 8, ED
BUN: 9 mg/dL (ref 6–23)
CHLORIDE: 91 mmol/L — AB (ref 96–112)
Calcium, Ion: 1.06 mmol/L — ABNORMAL LOW (ref 1.13–1.30)
Creatinine, Ser: 0.7 mg/dL (ref 0.50–1.10)
Glucose, Bld: 118 mg/dL — ABNORMAL HIGH (ref 70–99)
HEMATOCRIT: 50 % — AB (ref 36.0–46.0)
Hemoglobin: 17 g/dL — ABNORMAL HIGH (ref 12.0–15.0)
Potassium: 2.8 mmol/L — ABNORMAL LOW (ref 3.5–5.1)
Sodium: 129 mmol/L — ABNORMAL LOW (ref 135–145)
TCO2: 23 mmol/L (ref 0–100)

## 2014-06-19 LAB — CBC WITH DIFFERENTIAL/PLATELET
BASOS PCT: 0 % (ref 0–1)
Basophils Absolute: 0 10*3/uL (ref 0.0–0.1)
Eosinophils Absolute: 0 10*3/uL (ref 0.0–0.7)
Eosinophils Relative: 0 % (ref 0–5)
HCT: 44.5 % (ref 36.0–46.0)
Hemoglobin: 15.4 g/dL — ABNORMAL HIGH (ref 12.0–15.0)
Lymphocytes Relative: 22 % (ref 12–46)
Lymphs Abs: 2.6 10*3/uL (ref 0.7–4.0)
MCH: 29.4 pg (ref 26.0–34.0)
MCHC: 34.6 g/dL (ref 30.0–36.0)
MCV: 84.9 fL (ref 78.0–100.0)
MONOS PCT: 9 % (ref 3–12)
Monocytes Absolute: 1 10*3/uL (ref 0.1–1.0)
NEUTROS ABS: 8 10*3/uL — AB (ref 1.7–7.7)
Neutrophils Relative %: 69 % (ref 43–77)
PLATELETS: 428 10*3/uL — AB (ref 150–400)
RBC: 5.24 MIL/uL — ABNORMAL HIGH (ref 3.87–5.11)
RDW: 13.1 % (ref 11.5–15.5)
WBC: 11.6 10*3/uL — ABNORMAL HIGH (ref 4.0–10.5)

## 2014-06-19 LAB — COMPREHENSIVE METABOLIC PANEL
ALBUMIN: 3.6 g/dL (ref 3.5–5.2)
ALT: 29 U/L (ref 0–35)
ANION GAP: 14 (ref 5–15)
AST: 26 U/L (ref 0–37)
Alkaline Phosphatase: 107 U/L (ref 39–117)
BUN: 11 mg/dL (ref 6–23)
CO2: 26 mmol/L (ref 19–32)
CREATININE: 0.71 mg/dL (ref 0.50–1.10)
Calcium: 8.8 mg/dL (ref 8.4–10.5)
Chloride: 89 mmol/L — ABNORMAL LOW (ref 96–112)
GFR calc Af Amer: >90 mL/min (ref >90)
GFR calc non Af Amer: >90 mL/min (ref >90)
Glucose, Bld: 115 mg/dL — ABNORMAL HIGH (ref 70–99)
Potassium: 2.8 mmol/L — ABNORMAL LOW (ref 3.5–5.1)
Sodium: 129 mmol/L — ABNORMAL LOW (ref 135–145)
TOTAL PROTEIN: 8.4 g/dL — AB (ref 6.0–8.3)
Total Bilirubin: 0.8 mg/dL (ref 0.3–1.2)

## 2014-06-19 LAB — URINALYSIS, ROUTINE W REFLEX MICROSCOPIC
Bilirubin Urine: NEGATIVE
GLUCOSE, UA: NEGATIVE mg/dL
HGB URINE DIPSTICK: NEGATIVE
Leukocytes, UA: NEGATIVE
Nitrite: NEGATIVE
PROTEIN: NEGATIVE mg/dL
Specific Gravity, Urine: 1.018 (ref 1.005–1.030)
Urobilinogen, UA: 1 mg/dL (ref 0.0–1.0)
pH: 8 (ref 5.0–8.0)

## 2014-06-19 LAB — RAPID HIV SCREEN (HIV 1/2 AB+AG)
HIV 1/2 ANTIBODIES: NONREACTIVE
HIV-1 P24 Antigen - HIV24: NONREACTIVE

## 2014-06-19 LAB — I-STAT CG4 LACTIC ACID, ED: Lactic Acid, Venous: 1.71 mmol/L (ref 0.5–2.0)

## 2014-06-19 LAB — LIPASE, BLOOD: LIPASE: 22 U/L (ref 11–59)

## 2014-06-19 MED ORDER — ONDANSETRON HCL 4 MG/2ML IJ SOLN
4.0000 mg | Freq: Once | INTRAMUSCULAR | Status: AC
  Administered 2014-06-19: 4 mg via INTRAVENOUS
  Filled 2014-06-19: qty 2

## 2014-06-19 MED ORDER — POTASSIUM CHLORIDE CRYS ER 20 MEQ PO TBCR
40.0000 meq | EXTENDED_RELEASE_TABLET | Freq: Once | ORAL | Status: AC
  Administered 2014-06-19: 10 meq via ORAL
  Filled 2014-06-19: qty 2

## 2014-06-19 MED ORDER — ACETAMINOPHEN 160 MG/5ML PO SOLN
650.0000 mg | Freq: Once | ORAL | Status: AC
  Administered 2014-06-19: 650 mg via ORAL
  Filled 2014-06-19: qty 20.3

## 2014-06-19 MED ORDER — IBUPROFEN 200 MG PO TABS
400.0000 mg | ORAL_TABLET | Freq: Once | ORAL | Status: AC
  Administered 2014-06-19: 400 mg via ORAL
  Filled 2014-06-19: qty 2

## 2014-06-19 MED ORDER — HYDROCODONE-ACETAMINOPHEN 5-325 MG PO TABS
1.0000 | ORAL_TABLET | Freq: Once | ORAL | Status: DC
  Filled 2014-06-19: qty 1

## 2014-06-19 MED ORDER — HYDROCODONE-ACETAMINOPHEN 5-325 MG PO TABS: ORAL_TABLET | ORAL | Status: DC

## 2014-06-19 MED ORDER — VANCOMYCIN HCL IN DEXTROSE 1-5 GM/200ML-% IV SOLN
1000.0000 mg | Freq: Once | INTRAVENOUS | Status: AC
  Administered 2014-06-19: 1000 mg via INTRAVENOUS
  Filled 2014-06-19: qty 200

## 2014-06-19 MED ORDER — PIPERACILLIN-TAZOBACTAM 3.375 G IVPB: 3.3750 g | Freq: Three times a day (TID) | INTRAVENOUS | Status: DC

## 2014-06-19 MED ORDER — ACETAMINOPHEN 325 MG PO TABS
650.0000 mg | ORAL_TABLET | Freq: Once | ORAL | Status: DC
  Filled 2014-06-19: qty 2

## 2014-06-19 MED ORDER — PIPERACILLIN-TAZOBACTAM 3.375 G IVPB 30 MIN
3.3750 g | Freq: Once | INTRAVENOUS | Status: AC
  Administered 2014-06-19: 3.375 g via INTRAVENOUS
  Filled 2014-06-19: qty 50

## 2014-06-19 MED ORDER — VANCOMYCIN HCL IN DEXTROSE 1-5 GM/200ML-% IV SOLN: 1000.0000 mg | Freq: Three times a day (TID) | INTRAVENOUS | Status: DC

## 2014-06-19 MED ORDER — SODIUM CHLORIDE 0.9 % IV BOLUS (SEPSIS)
1000.0000 mL | INTRAVENOUS | Status: AC
  Administered 2014-06-19 (×3): 1000 mL via INTRAVENOUS

## 2014-06-19 MED ORDER — PROMETHAZINE HCL 25 MG PO TABS: 25.0000 mg | ORAL_TABLET | Freq: Four times a day (QID) | ORAL | Status: DC | PRN

## 2014-06-19 MED ORDER — MORPHINE SULFATE 4 MG/ML IJ SOLN
4.0000 mg | Freq: Once | INTRAMUSCULAR | Status: AC
  Administered 2014-06-19: 4 mg via INTRAVENOUS
  Filled 2014-06-19: qty 1

## 2014-06-19 NOTE — ED Notes (Signed)
Provided patient a turkey sandwich and ginger ale.  

## 2014-06-19 NOTE — ED Notes (Signed)
Pt report aligns with EMS- also reports left buttock sciatic pain radiating down posterior thigh. Denies urinary frequency/malodorous urine/urinary changes. Denies being around anyone sick. Denies cough/congestion aside from seasonal allergies. Reports increased stress x3 years. Lying on right side, appears to be in distress. Reports constant nausea but has been drinking fluids. No other c/c.

## 2014-06-19 NOTE — ED Notes (Addendum)
EKG given to EDP,[place],MD., for review. 

## 2014-06-19 NOTE — ED Notes (Signed)
Per EMS- reports increased left knee pain, nausea when eating, and generalized weakness x3 weeks. Ambulatory with steady gait from stretcher to hospital bed. A&Ox4. VS: 150/70 HR 90 CBG 103 mg/dl.

## 2014-06-19 NOTE — ED Notes (Signed)
Bed: WA08 Expected date:  Expected time:  Means of arrival:  Comments: weakness 

## 2014-06-19 NOTE — ED Notes (Signed)
Pt is unable to take the complete dose of POTASSIUM PO TABLETS. Informed Doris White, [place], new order obtained.

## 2014-06-19 NOTE — ED Notes (Signed)
Patient would like to take the [facility] after eating.

## 2014-06-19 NOTE — ED Provider Notes (Signed)
CSN: [id number]     Arrival date & time 06/19/14  1813 History   First MD Initiated Contact with Patient 06/19/14 1819     Chief Complaint  Patient presents with  . Weakness  . Fever     (Consider location/radiation/quality/duration/timing/severity/associated sxs/prior Treatment) HPI    SERENITI WAN is a 64 y.o. female with past medical history significant for hypertension complaining of right gluteal pain rating down the posterior of the right leg which she's had in the past, however, this is more severe. Patient reports that she's been feeling generally fatigued with nausea having productive cough which she attributes to her seasonal allergies. Patient denies sore throat, diffuse myalgia, emesis, decreased water intake, decreased urinary output, dysuria, hematuria, headache, cervicalgia, rash, chest pain, shortness of breath, abdominal pain, melena, hematochezia, numbness, weakn history of IV drug use. She denies specifically low back pain. Patient states she had a similar episode in the past and was diagnosed with pancreatitis.  Past Medical History  Diagnosis Date  . Hypertension   . Pancreatitis    History reviewed. No pertinent past surgical history. Family History  Problem Relation Age of Onset  . Diabetes Mother   . Heart failure Father    History  Substance Use Topics  . Smoking status: Never Smoker   . Smokeless tobacco: Not on file  . Alcohol Use: Yes   OB History    No data available     Review of Systems  10 systems reviewed and found to be negative, except as noted in the HPI.   Allergies  Erythromycin; Lisinopril; and Other  Home Medications   Prior to Admission medications   Medication Sig Start Date End Date Taking? Authorizing Provider  amLODipine (NORVASC) 5 MG tablet Take 1 tablet (5 mg total) by mouth daily. 03/24/14  Yes Domenic Moras, PA-C  hydrochlorothiazide (HYDRODIURIL) 25 MG tablet Take 1 tablet (25 mg total) by mouth daily. 03/24/14  Yes Domenic Moras, PA-C  cephALEXin (KEFLEX) 500 MG capsule Take 1 capsule (500 mg total) by mouth 2 (two) times daily. Patient not taking: Reported on 03/27/2014 06/18/12   Jeannett Senior, PA-C  HYDROcodone-acetaminophen (NORCO/VICODIN) 5-325 MG per tablet Take 1-2 tablets by mouth every 6 hours as needed for pain and/or cough. 06/19/14   Umar Patmon, PA-C  promethazine (PHENERGAN) 25 MG tablet Take 1 tablet (25 mg total) by mouth every 6 (six) hours as needed for nausea or vomiting. 06/19/14   Elmyra Ricks Lyvia Mondesir, PA-C   BP 124/62 mmHg  Pulse 77  Temp(Src) 98.7 F (37.1 C) (Oral)  Resp 18  Wt 200 lb (90.719 kg)  SpO2 94% Physical Exam  Constitutional: She appears well-developed and well-nourished.  Obese, appears uncomfortable, flushed  HENT:  Head: Normocephalic.  Mouth/Throat: Oropharynx is clear and moist.  Dry MM  Eyes: Conjunctivae are normal. Pupils are equal, round, and reactive to light.  Neck: Normal range of motion. Neck supple.  FROM to C-spine. Pt can touch chin to chest without discomfort. No TTP of midline cervical spine.   Cardiovascular: Normal rate, regular rhythm and intact distal pulses.   Pulmonary/Chest: Effort normal and breath sounds normal. No respiratory distress. She has no wheezes. She has no rales. She exhibits no tenderness.  Abdominal: Soft. Bowel sounds are normal. She exhibits no distension and no mass. There is no tenderness. There is no rebound and no guarding.  Musculoskeletal: Normal range of motion.  Neurological: She is alert.  No point tenderness to percussion of lumbar spinal  processes.  No TTP or paraspinal muscular spasm. Strength is 5 out of 5 to bilateral lower extremities at hip and knee; extensor hallucis longus 5 out of 5. Ankle strength 5 out of 5, no clonus, neurovascularly intact. No saddle anaesthesia. Patellar reflexes are 2+ bilaterally.    Straight leg raise is negative bilaterally  Psychiatric: She has a normal mood and affect.  Nursing  note and vitals reviewed.   ED Course  Procedures (including critical care time) Labs Review Labs Reviewed  CBC WITH DIFFERENTIAL/PLATELET - Abnormal; Notable for the following:    WBC 11.6 (*)    RBC 5.24 (*)    Hemoglobin 15.4 (*)    Platelets 428 (*)    Neutro Abs 8.0 (*)    All other components within normal limits  COMPREHENSIVE METABOLIC PANEL - Abnormal; Notable for the following:    Sodium 129 (*)    Potassium 2.8 (*)    Chloride 89 (*)    Glucose, Bld 115 (*)    Total Protein 8.4 (*)    All other components within normal limits  URINALYSIS, ROUTINE W REFLEX MICROSCOPIC - Abnormal; Notable for the following:    APPearance CLOUDY (*)    Ketones, ur >80 (*)    All other components within normal limits  I-STAT CHEM 8, ED - Abnormal; Notable for the following:    Sodium 129 (*)    Potassium 2.8 (*)    Chloride 91 (*)    Glucose, Bld 118 (*)    Calcium, Ion 1.06 (*)    Hemoglobin 17.0 (*)    HCT 50.0 (*)    All other components within normal limits  CULTURE, BLOOD (ROUTINE X 2)  CULTURE, BLOOD (ROUTINE X 2)  URINE CULTURE  RAPID HIV SCREEN (HIV 1/2 AB+AG)  LIPASE, BLOOD  I-STAT CG4 LACTIC ACID, ED    Imaging Review Dg Chest Port 1 View  06/19/2014   CLINICAL DATA:  Weakness, fever  EXAM: PORTABLE CHEST - 1 VIEW  COMPARISON:  04/06/2012  FINDINGS: There are mild chronic bronchitic changes. There is no focal parenchymal opacity, pleural effusion, or pneumothorax. The heart and mediastinal contours are unremarkable.  The osseous structures are unremarkable.  IMPRESSION: No active disease.   Electronically Signed   By: Kathreen Devoid   On: 06/19/2014 19:41     EKG Interpretation   Date/Time:  Sunday June 19 2014 20:51:30 EDT Ventricular Rate:  77 PR Interval:  151 QRS Duration: 105 QT Interval:  443 QTC Calculation: 501 R Axis:   52 Text Interpretation:  Sinus rhythm Prolonged QT interval No significant  change since last tracing Confirmed by Zenia Resides  MD, ANTHONY  ([id number]) on  06/19/2014 10:41:19 PM      MDM   Final diagnoses:  Lumbar radiculopathy, acute  Fever, unspecified fever cause  Cough   Filed Vitals:   06/19/14 2015 06/19/14 2030 06/19/14 2049 06/19/14 2300  BP: 127/56 133/54 113/88 124/62  Pulse: 75 79 82 77  Temp:   99.2 F (37.3 C) 98.7 F (37.1 C)  TempSrc:   Oral Oral  Resp: 14 16 17 18   Weight:      SpO2: 96% 96% 94% 94%    Medications  sodium chloride 0.9 % bolus 1,000 mL (0 mLs Intravenous Stopped 06/19/14 2159)  piperacillin-tazobactam (ZOSYN) IVPB 3.375 g (0 g Intravenous Stopped 06/19/14 2029)  vancomycin (VANCOCIN) IVPB 1000 mg/200 mL premix (0 mg Intravenous Stopped 06/19/14 2159)  morphine 4 MG/ML injection 4 mg (4 mg  Intravenous Given 06/19/14 1928)  ondansetron (ZOFRAN) injection 4 mg (4 mg Intravenous Given 06/19/14 1928)  acetaminophen (TYLENOL) solution 650 mg (650 mg Oral Given 06/19/14 1946)  potassium chloride SA (K-DUR,KLOR-CON) CR tablet 40 mEq (10 mEq Oral Given 06/19/14 2035)  ibuprofen (ADVIL,MOTRIN) tablet 400 mg (400 mg Oral Given 06/19/14 2238)    Vonna Drafts is a pleasant 64 y.o. female presenting with left gluteal pain radiating down the left leg consistent with a lumbar radiculopathy. Patient is found to be febrile to 101.3. Initiated sepsis protocol with broad-spectrum antibiotics. She endorses cough which she attributes to her seasonal allergies, lung sounds are clear to auscultation, patient saturating adequately on room air. Chest x-rays without infiltrate. Patient is found to be hypokalemic at 2.8. She takes hydrochlorothiazide think that may be the culprit. Urinalysis is without signs of infection and it does show ketones of greater than 80. Patient states that she's had decreased by mouth intake. Patient is taking no medication over the last several days, decreased appetite is likely secondary to her fever. She has a normal lactic acid and days mild leukocytosis of 11.6. Discussed with patient the  importance of eating high potassium foods. Will refer her to the wellness Center. I think the fever is secondary to viral syndrome.  Discussed case with attending MD who agrees with plan and stability to d/c to home.   Evaluation does not show pathology that would require ongoing emergent intervention or inpatient treatment. Pt is hemodynamically stable and mentating appropriately. Discussed findings and plan with patient/guardian, who agrees with care plan. All questions answered. Return precautions discussed and outpatient follow up given.        Monico Blitz, PA-C 06/20/14 1572  Lacretia Leigh, MD 06/21/14 [phone]

## 2014-06-19 NOTE — Discharge Instructions (Signed)
For pain control please take ibuprofen (also known as Motrin or Advil) 800mg  (this is normally 4 over the counter pills) 3 times a day  for 5 days. Take with food to minimize stomach irritation.  Take vicodin for breakthrough pain, do not drink alcohol, drive, care for children or do other critical tasks while taking vicodin.  Push fluids: take small frequent sips of water or Gatorade, do not drink any soda, juice or caffeinated beverages.    Do not hesitate to return to the Emergency Department for any new, worsening or concerning symptoms.   If you do not have a primary care doctor you can establish one at the   [facility]: [place] [id number] [phone]  After you establish care. Let them know you were seen in the emergency room. They must obtain records for further management.

## 2014-06-19 NOTE — Progress Notes (Signed)
ANTIBIOTIC CONSULT NOTE - INITIAL  Pharmacy Consult for vancomycin and zosyn Indication: rule out sepsis  Allergies  Allergen Reactions  . Erythromycin Other (See Comments)    GI upset  . Lisinopril     Pancreatitis in past   . Other Other (See Comments)    Over the counter cough syrups.  Vomiting and diarrhea.    Patient Measurements: weight est 91 kg, height 63 inches   Vital Signs: Temp: 101.3 F (38.5 C) (04/24 1836) Temp Source: Oral (04/24 1836) BP: 152/85 mmHg (04/24 1836) Pulse Rate: 88 (04/24 1836) Intake/Output from previous day:   Intake/Output from this shift:    Labs: No results for input(s): WBC, HGB, PLT, LABCREA, CREATININE in the last 72 hours. CrCl cannot be calculated (Unknown ideal weight.). No results for input(s): VANCOTROUGH, VANCOPEAK, VANCORANDOM, GENTTROUGH, GENTPEAK, GENTRANDOM, TOBRATROUGH, TOBRAPEAK, TOBRARND, AMIKACINPEAK, AMIKACINTROU, AMIKACIN in the last 72 hours.   Microbiology: No results found for this or any previous visit (from the past 720 hour(s)).  Medical History: Past Medical History  Diagnosis Date  . Hypertension   . Pancreatitis     Medications:   see med rec   Assessment: Patient is a 64 y.o F presented to the ED with c/o knee pain and generalized weakness for the past few weeks.  She was found to have Tmax 101.  To start broad abx with vancomycin and zosyn for r/o sepsis.  Zosyn 3.375 gm IV x1 over 30 minutes given at 1943 in the ED.  Vancomycin 1gm IV x1 ordered by MD.  Goal of Therapy:  Vancomycin trough level 15-20 mcg/ml  Plan:  - zosyn 3.375 gm IV q8h (infuse over 4 hours) - vancomycin 1gm IV q8h  Doris White P 06/19/2014,6:59 PM

## 2014-06-21 LAB — URINE CULTURE
CULTURE: NO GROWTH
Colony Count: NO GROWTH

## 2014-06-26 ENCOUNTER — Encounter (HOSPITAL_COMMUNITY): Payer: Self-pay | Admitting: *Deleted

## 2014-06-26 ENCOUNTER — Emergency Department (HOSPITAL_COMMUNITY)
Admission: EM | Admit: 2014-06-26 | Discharge: 2014-06-26 | Disposition: A | Discharge status: Home / Self Care | Payer: Self-pay | Attending: Emergency Medicine | Admitting: Emergency Medicine

## 2014-06-26 DIAGNOSIS — M79605 Pain in left leg: Secondary | ICD-10-CM

## 2014-06-26 DIAGNOSIS — M545 Low back pain, unspecified: Secondary | ICD-10-CM

## 2014-06-26 DIAGNOSIS — Z792 Long term (current) use of antibiotics: Secondary | ICD-10-CM | POA: Insufficient documentation

## 2014-06-26 DIAGNOSIS — Z79899 Other long term (current) drug therapy: Secondary | ICD-10-CM | POA: Insufficient documentation

## 2014-06-26 DIAGNOSIS — Z8719 Personal history of other diseases of the digestive system: Secondary | ICD-10-CM | POA: Insufficient documentation

## 2014-06-26 DIAGNOSIS — I10 Essential (primary) hypertension: Secondary | ICD-10-CM | POA: Insufficient documentation

## 2014-06-26 LAB — CULTURE, BLOOD (ROUTINE X 2)
CULTURE: NO GROWTH
Culture: NO GROWTH

## 2014-06-26 MED ORDER — PREDNISONE 20 MG PO TABS: ORAL_TABLET | ORAL | Status: DC

## 2014-06-26 MED ORDER — METHOCARBAMOL 500 MG PO TABS: 500.0000 mg | ORAL_TABLET | Freq: Two times a day (BID) | ORAL | Status: DC

## 2014-06-26 MED ORDER — OXYCODONE-ACETAMINOPHEN 5-325 MG PO TABS: 2.0000 | ORAL_TABLET | ORAL | Status: DC | PRN

## 2014-06-26 NOTE — ED Provider Notes (Signed)
CSN: [id number]     Arrival date & time 06/26/14  1357 History  This chart was scribed for non-physician practitioner Doris Moras, PA, working with Nat Christen, MD, by Eustaquio Maize, ED Scribe. This patient was seen in room WTR5/WTR5 and the patient's care was started at 2:41 PM.    Chief Complaint  Patient presents with  . Sciatica   The history is provided by the patient. No language interpreter was used.     HPI Comments: Doris White is a 64 y.o. female who presents to the Emergency Department complaining of constant, gradually worsening, left sciatica pain that began 1 week ago. She states that the pain is located in her left buttock, radiating down to her left knee. Pt describes the pain as constant, severe, and throbbing in sensation. Pt mentions that she is having numbness to the left thigh, swelling to the left knee, and right knee pain. Pt is able to ambulate but is having difficulty due to the pain. She has been using a heating pad without relief. Denies recent injury or trauma to the area. She does report that she fell approximately 8 months ago, landing on knees, but did not have this specific pain until recently. She was seen in the ED 4/24 (1 week ago) for the same symptoms as well as a cough and fatigue. Pt was given IV medication while in the ED. She was also given a prescription for Hydrocodone.  She states that her viral symptoms have since improved but that she came back today for ongoing leg pain.Pt reports that she has been taking prescription strength Ibuprofen and Hydrocodone without relief. Denies urinary or bowel incontinence, saddle anesthesia, or any other symptoms. No hx IV drug abuse or cancer.    Past Medical History  Diagnosis Date  . Hypertension   . Pancreatitis    History reviewed. No pertinent past surgical history. Family History  Problem Relation Age of Onset  . Diabetes Mother   . Heart failure Father    History  Substance Use Topics  . Smoking status:  Never Smoker   . Smokeless tobacco: Not on file  . Alcohol Use: Yes   OB History    No data available     Review of Systems  Musculoskeletal: Positive for arthralgias (Left buttock pain radiating down to left knee. Right knee pain. ) and gait problem.  Neurological: Positive for numbness (To left thigh. ).      Allergies  Erythromycin; Lisinopril; and Other  Home Medications   Prior to Admission medications   Medication Sig Start Date End Date Taking? Authorizing Provider  amLODipine (NORVASC) 5 MG tablet Take 1 tablet (5 mg total) by mouth daily. 03/24/14  Yes Doris Moras, PA-C  hydrochlorothiazide (HYDRODIURIL) 25 MG tablet Take 1 tablet (25 mg total) by mouth daily. 03/24/14  Yes Doris Moras, PA-C  HYDROcodone-acetaminophen (NORCO/VICODIN) 5-325 MG per tablet Take 1-2 tablets by mouth every 6 hours as needed for pain and/or cough. 06/19/14  Yes Nicole Pisciotta, PA-C  ibuprofen (ADVIL,MOTRIN) 800 MG tablet Take 800 mg by mouth every 8 (eight) hours as needed for moderate pain.   Yes Historical Provider, MD  cephALEXin (KEFLEX) 500 MG capsule Take 1 capsule (500 mg total) by mouth 2 (two) times daily. Patient not taking: Reported on 03/27/2014 06/18/12   Jeannett Senior, PA-C  promethazine (PHENERGAN) 25 MG tablet Take 1 tablet (25 mg total) by mouth every 6 (six) hours as needed for nausea or vomiting. 06/19/14  Nicole Pisciotta, PA-C   Triage Vitals: BP 130/63 mmHg  Pulse 73  Temp(Src) 98.2 F (36.8 C) (Oral)  Resp 18  SpO2 98%   Physical Exam  Constitutional: She is oriented to person, place, and time. She appears well-developed and well-nourished. No distress.  HENT:  Head: Normocephalic and atraumatic.  Eyes: Conjunctivae and EOM are normal.  Neck: Neck supple. No tracheal deviation present.  Cardiovascular: Normal rate.   Pulmonary/Chest: Effort normal. No respiratory distress.  Musculoskeletal: Normal range of motion.  Tenderness to left sacral ilial region and left  thigh.  Patellar deep tendon reflex intact.  No foot drop.   Positive left straight leg raise.   Neurological: She is alert and oriented to person, place, and time.  Skin: Skin is warm and dry.  Psychiatric: She has a normal mood and affect. Her behavior is normal.  Nursing note and vitals reviewed.   ED Course  Procedures (including critical care time)  DIAGNOSTIC STUDIES: Oxygen Saturation is 98% on RA, normal by my interpretation.    COORDINATION OF CARE: 2:47 PM- Pt able to ambulate. Suspect pinched nerve. Discussed starting pt on course of steroids, muscle relaxer, and stronger pain medication. Advised pt to follow up with orthopedist if pain persists. Will give pt referral to orthopedist.   Labs Review Labs Reviewed - No data to display  Imaging Review No results found.   EKG Interpretation None      MDM   Final diagnoses:  Low back pain radiating to left leg   BP 130/63 mmHg  Pulse 73  Temp(Src) 98.2 F (36.8 C) (Oral)  Resp 18  SpO2 98%   I personally performed the services described in this documentation, which was scribed in my presence. The recorded information has been reviewed and is accurate.       Doris Moras, PA-C 06/26/14 1501  Nat Christen, MD 06/26/14 [phone]

## 2014-06-26 NOTE — Discharge Instructions (Signed)

## 2014-06-26 NOTE — ED Notes (Signed)
Pt complains of pain in her left buttock radiating down her left leg. Pt was seen in ED on 4/24 and given prescription for vicodin and ibuprofen. Pt states she is still in pain and is only able to walk short distances.

## 2014-07-04 ENCOUNTER — Ambulatory Visit: Payer: Self-pay | Attending: Family Medicine | Admitting: Family Medicine

## 2014-07-04 ENCOUNTER — Encounter: Payer: Self-pay | Admitting: Family Medicine

## 2014-07-04 VITALS — BP 118/76 | HR 80 | Temp 98.4°F | Resp 18 | Ht 63.0 in | Wt 251.0 lb

## 2014-07-04 DIAGNOSIS — M5442 Lumbago with sciatica, left side: Secondary | ICD-10-CM

## 2014-07-04 DIAGNOSIS — Z6841 Body Mass Index (BMI) 40.0 and over, adult: Secondary | ICD-10-CM | POA: Insufficient documentation

## 2014-07-04 DIAGNOSIS — I10 Essential (primary) hypertension: Secondary | ICD-10-CM

## 2014-07-04 MED ORDER — IBUPROFEN 600 MG PO TABS: 600.0000 mg | ORAL_TABLET | Freq: Three times a day (TID) | ORAL | Status: AC | PRN

## 2014-07-04 NOTE — Progress Notes (Signed)
   Subjective:    Patient ID: Doris White, female    DOB: 10-05-1950, 64 y.o.   MRN: [id number] CC:  HPI  1. BACK PAIN went to ED on 06/19/14 (given ibuprofen during the day and vicodin to take at night)  and 06/26/14 (given prednisone, percocet, robaxin)   Location: L hip pain Quality: sharp, throbbing Onset: 1 month ago  Worse with: walking, lying down  Better with: prednisone, pain medicine, sitting with a cushion Radiation: down to posterior leg  Trauma: no Best sitting/standing/leaning forward: sitting   Red Flags Fecal/urinary incontinence: no  Numbness/Weakness: no  Fever/chills/sweats: no   Night pain: yes  Unexplained weight loss: no  No relief with bedrest: yes  h/o cancer/immunosuppression: no  IV drug use: no  PMH of osteoporosis or chronic steroid use: no   Soc Hx: non smoker  Med Hx: HN  Surg Hx: negative   Review of Systems     Objective:   Physical Exam BP 118/76 mmHg  Pulse 80  Temp(Src) 98.4 F (36.9 C) (Oral)  Resp 18  Ht 5\' 3"  (1.6 m)  Wt 251 lb (113.853 kg)  BMI 44.47 kg/m2  SpO2 96% Wt Readings from Last 3 Encounters:  07/04/14 251 lb (113.853 kg)  06/19/14 200 lb (90.719 kg)  03/31/12 195 lb (88.451 kg)  General appearance: alert, cooperative and no distress Neck: no adenopathy, supple, symmetrical, trachea midline and thyroid not enlarged, symmetric, no tenderness/mass/nodules Lungs: clear to auscultation bilaterally Heart: regular rate and rhythm, S1, S2 normal, no murmur, click, rub or gallop  Back Exam: Back: Normal Curvature, no deformities or CVA tenderness  Paraspinal Tenderness: absent   LE Strength 5/5  LE Sensation: in tact  LE Reflexes 2+ and symmetric  Straight leg raise: negative b/l      Assessment & Plan:

## 2014-07-04 NOTE — Progress Notes (Signed)
Establish Care ED F/U inflammation of sciatic nerve

## 2014-07-04 NOTE — Patient Instructions (Addendum)
Doris White,  Thank you for coming in today. It was a pleasure meeting you. I look forward to being your primary doctor.  1. L hip pain with sciatica, improved. Take tart cherry juice which is a natural antiinflammatory Ibuprofen 600 mg if pain flares Robaxin muscle relaxer if pain flares Another course of prednisone if the above does not work   To prevent flares: Weight loss is recommended Also stretches, see below  F/u in 6 weeks for low back pain   Dr. Adrian Blackwater   Sciatica with Rehab The sciatic nerve runs from the back down the leg and is responsible for sensation and control of the muscles in the back (posterior) side of the thigh, lower leg, and foot. Sciatica is a condition that is characterized by inflammation of this nerve.  SYMPTOMS   Signs of nerve damage, including numbness and/or weakness along the posterior side of the lower extremity.  Pain in the back of the thigh that may also travel down the leg.  Pain that worsens when sitting for long periods of time.  Occasionally, pain in the back or buttock. CAUSES  Inflammation of the sciatic nerve is the cause of sciatica. The inflammation is due to something irritating the nerve. Common sources of irritation include:  Sitting for long periods of time.  Direct trauma to the nerve.  Arthritis of the spine.  Herniated or ruptured disk.  Slipping of the vertebrae (spondylolisthesis).  Pressure from soft tissues, such as muscles or ligament-like tissue (fascia). RISK INCREASES WITH:  Sports that place pressure or stress on the spine (football or weightlifting).  Poor strength and flexibility.  Failure to warm up properly before activity.  Family history of low back pain or disk disorders.  Previous back injury or surgery.  Poor body mechanics, especially when lifting, or poor posture. PREVENTION   Warm up and stretch properly before activity.  Maintain physical fitness:  Strength, flexibility, and  endurance.  Cardiovascular fitness.  Learn and use proper technique, especially with posture and lifting. When possible, have coach correct improper technique.  Avoid activities that place stress on the spine. PROGNOSIS If treated properly, then sciatica usually resolves within 6 weeks. However, occasionally surgery is necessary.  RELATED COMPLICATIONS   Permanent nerve damage, including pain, numbness, tingle, or weakness.  Chronic back pain.  Risks of surgery: infection, bleeding, nerve damage, or damage to surrounding tissues. TREATMENT Treatment initially involves resting from any activities that aggravate your symptoms. The use of ice and medication may help reduce pain and inflammation. The use of strengthening and stretching exercises may help reduce pain with activity. These exercises may be performed at home or with referral to a therapist. A therapist may recommend further treatments, such as transcutaneous electronic nerve stimulation (TENS) or ultrasound. Your caregiver may recommend corticosteroid injections to help reduce inflammation of the sciatic nerve. If symptoms persist despite non-surgical (conservative) treatment, then surgery may be recommended. MEDICATION  If pain medication is necessary, then nonsteroidal anti-inflammatory medications, such as aspirin and ibuprofen, or other minor pain relievers, such as acetaminophen, are often recommended.  Do not take pain medication for 7 days before surgery.  Prescription pain relievers may be given if deemed necessary by your caregiver. Use only as directed and only as much as you need.  Ointments applied to the skin may be helpful.  Corticosteroid injections may be given by your caregiver. These injections should be reserved for the most serious cases, because they may only be given a certain number  of times. HEAT AND COLD  Cold treatment (icing) relieves pain and reduces inflammation. Cold treatment should be applied  for 10 to 15 minutes every 2 to 3 hours for inflammation and pain and immediately after any activity that aggravates your symptoms. Use ice packs or massage the area with a piece of ice (ice massage).  Heat treatment may be used prior to performing the stretching and strengthening activities prescribed by your caregiver, physical therapist, or athletic trainer. Use a heat pack or soak the injury in warm water. SEEK MEDICAL CARE IF:  Treatment seems to offer no benefit, or the condition worsens.  Any medications produce adverse side effects. EXERCISES  RANGE OF MOTION (ROM) AND STRETCHING EXERCISES - Sciatica Most people with sciatic will find that their symptoms worsen with either excessive bending forward (flexion) or arching at the low back (extension). The exercises which will help resolve your symptoms will focus on the opposite motion. Your physician, physical therapist or athletic trainer will help you determine which exercises will be most helpful to resolve your low back pain. Do not complete any exercises without first consulting with your clinician. Discontinue any exercises which worsen your symptoms until you speak to your clinician. If you have pain, numbness or tingling which travels down into your buttocks, leg or foot, the goal of the therapy is for these symptoms to move closer to your back and eventually resolve. Occasionally, these leg symptoms will get better, but your low back pain may worsen; this is typically an indication of progress in your rehabilitation. Be certain to be very alert to any changes in your symptoms and the activities in which you participated in the 24 hours prior to the change. Sharing this information with your clinician will allow him/her to most efficiently treat your condition. These exercises may help you when beginning to rehabilitate your injury. Your symptoms may resolve with or without further involvement from your physician, physical therapist or  athletic trainer. While completing these exercises, remember:   Restoring tissue flexibility helps normal motion to return to the joints. This allows healthier, less painful movement and activity.  An effective stretch should be held for at least 30 seconds.  A stretch should never be painful. You should only feel a gentle lengthening or release in the stretched tissue. FLEXION RANGE OF MOTION AND STRETCHING EXERCISES: STRETCH - Flexion, Single Knee to Chest   Lie on a firm bed or floor with both legs extended in front of you.  Keeping one leg in contact with the floor, bring your opposite knee to your chest. Hold your leg in place by either grabbing behind your thigh or at your knee.  Pull until you feel a gentle stretch in your low back. Hold __________ seconds.  Slowly release your grasp and repeat the exercise with the opposite side. Repeat __________ times. Complete this exercise __________ times per day.  STRETCH - Flexion, Double Knee to Chest  Lie on a firm bed or floor with both legs extended in front of you.  Keeping one leg in contact with the floor, bring your opposite knee to your chest.  Tense your stomach muscles to support your back and then lift your other knee to your chest. Hold your legs in place by either grabbing behind your thighs or at your knees.  Pull both knees toward your chest until you feel a gentle stretch in your low back. Hold __________ seconds.  Tense your stomach muscles and slowly return one leg at a  time to the floor. Repeat __________ times. Complete this exercise __________ times per day.  STRETCH - Low Trunk Rotation   Lie on a firm bed or floor. Keeping your legs in front of you, bend your knees so they are both pointed toward the ceiling and your feet are flat on the floor.  Extend your arms out to the side. This will stabilize your upper body by keeping your shoulders in contact with the floor.  Gently and slowly drop both knees together  to one side until you feel a gentle stretch in your low back. Hold for __________ seconds.  Tense your stomach muscles to support your low back as you bring your knees back to the starting position. Repeat the exercise to the other side. Repeat __________ times. Complete this exercise __________ times per day  EXTENSION RANGE OF MOTION AND FLEXIBILITY EXERCISES: STRETCH - Extension, Prone on Elbows  Lie on your stomach on the floor, a bed will be too soft. Place your palms about shoulder width apart and at the height of your head.  Place your elbows under your shoulders. If this is too painful, stack pillows under your chest.  Allow your body to relax so that your hips drop lower and make contact more completely with the floor.  Hold this position for __________ seconds.  Slowly return to lying flat on the floor. Repeat __________ times. Complete this exercise __________ times per day.  RANGE OF MOTION - Extension, Prone Press Ups  Lie on your stomach on the floor, a bed will be too soft. Place your palms about shoulder width apart and at the height of your head.  Keeping your back as relaxed as possible, slowly straighten your elbows while keeping your hips on the floor. You may adjust the placement of your hands to maximize your comfort. As you gain motion, your hands will come more underneath your shoulders.  Hold this position __________ seconds.  Slowly return to lying flat on the floor. Repeat __________ times. Complete this exercise __________ times per day.  STRENGTHENING EXERCISES - Sciatica  These exercises may help you when beginning to rehabilitate your injury. These exercises should be done near your "sweet spot." This is the neutral, low-back arch, somewhere between fully rounded and fully arched, that is your least painful position. When performed in this safe range of motion, these exercises can be used for people who have either a flexion or extension based injury. These  exercises may resolve your symptoms with or without further involvement from your physician, physical therapist or athletic trainer. While completing these exercises, remember:   Muscles can gain both the endurance and the strength needed for everyday activities through controlled exercises.  Complete these exercises as instructed by your physician, physical therapist or athletic trainer. Progress with the resistance and repetition exercises only as your caregiver advises.  You may experience muscle soreness or fatigue, but the pain or discomfort you are trying to eliminate should never worsen during these exercises. If this pain does worsen, stop and make certain you are following the directions exactly. If the pain is still present after adjustments, discontinue the exercise until you can discuss the trouble with your clinician. STRENGTHENING - Deep Abdominals, Pelvic Tilt   Lie on a firm bed or floor. Keeping your legs in front of you, bend your knees so they are both pointed toward the ceiling and your feet are flat on the floor.  Tense your lower abdominal muscles to press your low back  into the floor. This motion will rotate your pelvis so that your tail bone is scooping upwards rather than pointing at your feet or into the floor.  With a gentle tension and even breathing, hold this position for __________ seconds. Repeat __________ times. Complete this exercise __________ times per day.  STRENGTHENING - Abdominals, Crunches   Lie on a firm bed or floor. Keeping your legs in front of you, bend your knees so they are both pointed toward the ceiling and your feet are flat on the floor. Cross your arms over your chest.  Slightly tip your chin down without bending your neck.  Tense your abdominals and slowly lift your trunk high enough to just clear your shoulder blades. Lifting higher can put excessive stress on the low back and does not further strengthen your abdominal muscles.  Control  your return to the starting position. Repeat __________ times. Complete this exercise __________ times per day.  STRENGTHENING - Quadruped, Opposite UE/LE Lift  Assume a hands and knees position on a firm surface. Keep your hands under your shoulders and your knees under your hips. You may place padding under your knees for comfort.  Find your neutral spine and gently tense your abdominal muscles so that you can maintain this position. Your shoulders and hips should form a rectangle that is parallel with the floor and is not twisted.  Keeping your trunk steady, lift your right hand no higher than your shoulder and then your left leg no higher than your hip. Make sure you are not holding your breath. Hold this position __________ seconds.  Continuing to keep your abdominal muscles tense and your back steady, slowly return to your starting position. Repeat with the opposite arm and leg. Repeat __________ times. Complete this exercise __________ times per day.  STRENGTHENING - Abdominals and Quadriceps, Straight Leg Raise   Lie on a firm bed or floor with both legs extended in front of you.  Keeping one leg in contact with the floor, bend the other knee so that your foot can rest flat on the floor.  Find your neutral spine, and tense your abdominal muscles to maintain your spinal position throughout the exercise.  Slowly lift your straight leg off the floor about 6 inches for a count of 15, making sure to not hold your breath.  Still keeping your neutral spine, slowly lower your leg all the way to the floor. Repeat this exercise with each leg __________ times. Complete this exercise __________ times per day. POSTURE AND BODY MECHANICS CONSIDERATIONS - Sciatica Keeping correct posture when sitting, standing or completing your activities will reduce the stress put on different body tissues, allowing injured tissues a chance to heal and limiting painful experiences. The following are general  guidelines for improved posture. Your physician or physical therapist will provide you with any instructions specific to your needs. While reading these guidelines, remember:  The exercises prescribed by your provider will help you have the flexibility and strength to maintain correct postures.  The correct posture provides the optimal environment for your joints to work. All of your joints have less wear and tear when properly supported by a spine with good posture. This means you will experience a healthier, less painful body.  Correct posture must be practiced with all of your activities, especially prolonged sitting and standing. Correct posture is as important when doing repetitive low-stress activities (typing) as it is when doing a single heavy-load activity (lifting). RESTING POSITIONS Consider which positions are most  painful for you when choosing a resting position. If you have pain with flexion-based activities (sitting, bending, stooping, squatting), choose a position that allows you to rest in a less flexed posture. You would want to avoid curling into a fetal position on your side. If your pain worsens with extension-based activities (prolonged standing, working overhead), avoid resting in an extended position such as sleeping on your stomach. Most people will find more comfort when they rest with their spine in a more neutral position, neither too rounded nor too arched. Lying on a non-sagging bed on your side with a pillow between your knees, or on your back with a pillow under your knees will often provide some relief. Keep in mind, being in any one position for a prolonged period of time, no matter how correct your posture, can still lead to stiffness. PROPER SITTING POSTURE In order to minimize stress and discomfort on your spine, you must sit with correct posture Sitting with good posture should be effortless for a healthy body. Returning to good posture is a gradual process. Many  people can work toward this most comfortably by using various supports until they have the flexibility and strength to maintain this posture on their own. When sitting with proper posture, your ears will fall over your shoulders and your shoulders will fall over your hips. You should use the back of the chair to support your upper back. Your low back will be in a neutral position, just slightly arched. You may place a small pillow or folded towel at the base of your low back for support.  When working at a desk, create an environment that supports good, upright posture. Without extra support, muscles fatigue and lead to excessive strain on joints and other tissues. Keep these recommendations in mind: CHAIR:   A chair should be able to slide under your desk when your back makes contact with the back of the chair. This allows you to work closely.  The chair's height should allow your eyes to be level with the upper part of your monitor and your hands to be slightly lower than your elbows. BODY POSITION  Your feet should make contact with the floor. If this is not possible, use a foot rest.  Keep your ears over your shoulders. This will reduce stress on your neck and low back. INCORRECT SITTING POSTURES   If you are feeling tired and unable to assume a healthy sitting posture, do not slouch or slump. This puts excessive strain on your back tissues, causing more damage and pain. Healthier options include:  Using more support, like a lumbar pillow.  Switching tasks to something that requires you to be upright or walking.  Talking a brief walk.  Lying down to rest in a neutral-spine position. PROLONGED STANDING WHILE SLIGHTLY LEANING FORWARD  When completing a task that requires you to lean forward while standing in one place for a long time, place either foot up on a stationary 2-4 inch high object to help maintain the best posture. When both feet are on the ground, the low back tends to lose its  slight inward curve. If this curve flattens (or becomes too large), then the back and your other joints will experience too much stress, fatigue more quickly and can cause pain.  CORRECT STANDING POSTURES Proper standing posture should be assumed with all daily activities, even if they only take a few moments, like when brushing your teeth. As in sitting, your ears should fall over your  shoulders and your shoulders should fall over your hips. You should keep a slight tension in your abdominal muscles to brace your spine. Your tailbone should point down to the ground, not behind your body, resulting in an over-extended swayback posture.  INCORRECT STANDING POSTURES  Common incorrect standing postures include a forward head, locked knees and/or an excessive swayback. WALKING Walk with an upright posture. Your ears, shoulders and hips should all line-up. PROLONGED ACTIVITY IN A FLEXED POSITION When completing a task that requires you to bend forward at your waist or lean over a low surface, try to find a way to stabilize 3 of 4 of your limbs. You can place a hand or elbow on your thigh or rest a knee on the surface you are reaching across. This will provide you more stability so that your muscles do not fatigue as quickly. By keeping your knees relaxed, or slightly bent, you will also reduce stress across your low back. CORRECT LIFTING TECHNIQUES DO :   Assume a wide stance. This will provide you more stability and the opportunity to get as close as possible to the object which you are lifting.  Tense your abdominals to brace your spine; then bend at the knees and hips. Keeping your back locked in a neutral-spine position, lift using your leg muscles. Lift with your legs, keeping your back straight.  Test the weight of unknown objects before attempting to lift them.  Try to keep your elbows locked down at your sides in order get the best strength from your shoulders when carrying an object.  Always  ask for help when lifting heavy or awkward objects. INCORRECT LIFTING TECHNIQUES DO NOT:   Lock your knees when lifting, even if it is a small object.  Bend and twist. Pivot at your feet or move your feet when needing to change directions.  Assume that you cannot safely pick up a paperclip without proper posture. Document Released: 02/11/2005 Document Revised: 06/28/2013 Document Reviewed: 05/26/2008 [facility] Patient Information 2015 [place], [place]. This information is not intended to replace advice given to you by your health care provider. Make sure you discuss any questions you have with your health care provider.

## 2014-07-04 NOTE — Assessment & Plan Note (Signed)
1. L hip pain with sciatica, improved. Take tart cherry juice which is a natural antiinflammatory Ibuprofen 600 mg if pain flares Robaxin muscle relaxer if pain flares Another course of prednisone if the above does not work   To prevent flares: Weight loss is recommended Also stretches, see below

## 2014-07-04 NOTE — Addendum Note (Signed)
Addended by: Boykin Nearing on: 07/04/2014 05:18 PM   Modules accepted: Orders

## 2014-07-08 ENCOUNTER — Other Ambulatory Visit: Payer: Self-pay | Admitting: Family Medicine

## 2014-07-08 ENCOUNTER — Telehealth: Payer: Self-pay | Admitting: Family Medicine

## 2014-07-08 ENCOUNTER — Telehealth: Payer: Self-pay | Admitting: Cardiology

## 2014-07-08 DIAGNOSIS — M5442 Lumbago with sciatica, left side: Secondary | ICD-10-CM

## 2014-07-08 MED ORDER — TRAMADOL HCL 50 MG PO TABS: 50.0000 mg | ORAL_TABLET | Freq: Three times a day (TID) | ORAL | Status: DC | PRN

## 2014-07-08 MED ORDER — PREDNISONE 20 MG PO TABS: 60.0000 mg | ORAL_TABLET | Freq: Every day | ORAL | Status: DC

## 2014-07-08 MED ORDER — METHYLPREDNISOLONE 4 MG PO TBPK: ORAL_TABLET | ORAL | Status: DC

## 2014-07-08 NOTE — Telephone Encounter (Signed)
Please call patient Medrol dose pack sent to her pharmacy, stop ibuprofen while taking the steroid  Tramadol rx placed up front for pick up

## 2014-07-08 NOTE — Telephone Encounter (Signed)
Patient called and was advised that rx is ready to pick up, and was advised to not take ibuprofen while on the steroid

## 2014-07-08 NOTE — Telephone Encounter (Signed)
Patient called stating that the the Ibuprofen and muscle relaxer is not working, she would like another course of prednisone. She is in a lot of pain and would like to speak to her PCP. Please f/u with pt.

## 2014-12-26 ENCOUNTER — Ambulatory Visit (INDEPENDENT_AMBULATORY_CARE_PROVIDER_SITE_OTHER): Payer: Self-pay | Admitting: Family Medicine

## 2014-12-26 VITALS — BP 130/80 | HR 87 | Temp 99.6°F | Resp 17 | Ht 64.5 in | Wt 235.0 lb

## 2014-12-26 DIAGNOSIS — E871 Hypo-osmolality and hyponatremia: Secondary | ICD-10-CM

## 2014-12-26 DIAGNOSIS — Z8719 Personal history of other diseases of the digestive system: Secondary | ICD-10-CM

## 2014-12-26 DIAGNOSIS — R110 Nausea: Secondary | ICD-10-CM

## 2014-12-26 DIAGNOSIS — R5381 Other malaise: Secondary | ICD-10-CM

## 2014-12-26 DIAGNOSIS — B349 Viral infection, unspecified: Secondary | ICD-10-CM

## 2014-12-26 LAB — POCT CBC
GRANULOCYTE PERCENT: 72.5 % (ref 37–80)
HCT, POC: 42.3 % (ref 37.7–47.9)
Hemoglobin: 14.3 g/dL (ref 12.2–16.2)
LYMPH, POC: 2 (ref 0.6–3.4)
MCH, POC: 28.3 pg (ref 27–31.2)
MCHC: 33.8 g/dL (ref 31.8–35.4)
MCV: 83.7 fL (ref 80–97)
MID (CBC): 0.5 (ref 0–0.9)
MPV: 6.5 fL (ref 0–99.8)
POC GRANULOCYTE: 6.5 (ref 2–6.9)
POC LYMPH %: 22.3 % (ref 10–50)
POC MID %: 5.2 % (ref 0–12)
Platelet Count, POC: 421 10*3/uL (ref 142–424)
RBC: 5.05 M/uL (ref 4.04–5.48)
RDW, POC: 14.1 %
WBC: 8.9 10*3/uL (ref 4.6–10.2)

## 2014-12-26 LAB — COMPREHENSIVE METABOLIC PANEL
ALBUMIN: 3.6 g/dL (ref 3.6–5.1)
ALK PHOS: 88 U/L (ref 33–130)
ALT: 18 U/L (ref 6–29)
AST: 17 U/L (ref 10–35)
BUN: 10 mg/dL (ref 7–25)
CALCIUM: 8.8 mg/dL (ref 8.6–10.4)
CHLORIDE: 90 mmol/L — AB (ref 98–110)
CO2: 28 mmol/L (ref 20–31)
Creat: 0.68 mg/dL (ref 0.50–0.99)
Glucose, Bld: 146 mg/dL — ABNORMAL HIGH (ref 65–99)
POTASSIUM: 4.1 mmol/L (ref 3.5–5.3)
Sodium: 130 mmol/L — ABNORMAL LOW (ref 135–146)
Total Bilirubin: 0.8 mg/dL (ref 0.2–1.2)
Total Protein: 6.9 g/dL (ref 6.1–8.1)

## 2014-12-26 LAB — GLUCOSE, POCT (MANUAL RESULT ENTRY): POC Glucose: 139 mg/dl — AB (ref 70–99)

## 2014-12-26 LAB — LIPASE: LIPASE: 12 U/L (ref 7–60)

## 2014-12-26 MED ORDER — ONDANSETRON 4 MG PO TBDP
8.0000 mg | ORAL_TABLET | Freq: Once | ORAL | Status: AC
  Administered 2014-12-26: 8 mg via ORAL

## 2014-12-26 MED ORDER — IBUPROFEN 200 MG PO TABS
400.0000 mg | ORAL_TABLET | Freq: Once | ORAL | Status: AC
  Administered 2014-12-26: 400 mg via ORAL

## 2014-12-26 NOTE — Progress Notes (Addendum)
[facility] [address], [place] [address] [phone]  Date:  12/26/2014   Name:  Doris White   DOB:  02-22-1951   MRN:  [id number]  PCP:  Jenell Milliner, MD    Chief Complaint: Fatigue and Anorexia   History of Present Illness:  Doris White is a 64 y.o. very pleasant female patient who presents with the following:  Here today with illness.   About a week ago she felt like she "had a bad cold" with head congestion, cough that was bringing up phlegm.   She rested and drank plenty of liquids, took mucinex.   She felt very bad for a couple of days, then started to feel better.  Her nasal sx are now better, she has a mild cough. She thought she was getting better, but noted that she was having a hard time eating.  She would feel hungry but just does not feel like eating. "after a few bites I just don't want any more." She is not having any abd pain.  She notes a "low grade nausea" with eating.  No vomiting. She is still having BM  She did have pancreatitis back in 2013.  This was thought to be due to lisinopril which she has since stopped She is on medication for HTN   Over the weekend she felt very weak and tired.  She suspects this is due to not eating enough  She is still drinking fluids.  She is does not have DM to her knowledge  She did not check her temp but did feel cold over the weekend.      Patient Active Problem List   Diagnosis Date Noted  . Morbid obesity with BMI of 40.0-44.9, adult ([place]) 07/04/2014  . HTN (hypertension) 07/04/2014  . Low back pain with left-sided sciatica 07/04/2014    Past Medical History  Diagnosis Date  . Pancreatitis 2013  . Hypertension Dx 2013    No past surgical history on file.  Social History  Substance Use Topics  . Smoking status: Never Smoker   . Smokeless tobacco: Never Used  . Alcohol Use: Yes    Family History  Problem Relation Age of Onset  . Diabetes Mother   . Heart failure Father   .  COPD Father     Allergies  Allergen Reactions  . Erythromycin Other (See Comments)    GI upset  . Lisinopril     Pancreatitis in past   . Other Other (See Comments)    Over the counter cough syrups.  Vomiting and diarrhea.    Medication list has been reviewed and updated.  Current Outpatient Prescriptions on File Prior to Visit  Medication Sig Dispense Refill  . amLODipine (NORVASC) 5 MG tablet Take 1 tablet (5 mg total) by mouth daily. 30 tablet 0  . hydrochlorothiazide (HYDRODIURIL) 25 MG tablet Take 1 tablet (25 mg total) by mouth daily. 30 tablet 0  . ibuprofen (ADVIL,MOTRIN) 600 MG tablet Take 1 tablet (600 mg total) by mouth every 8 (eight) hours as needed for moderate pain. 30 tablet 1   No current facility-administered medications on file prior to visit.    Review of Systems:  As per HPI- otherwise negative.   Physical Examination: Filed Vitals:   12/26/14 1024  BP: 130/80  Pulse: 87  Temp: 99.6 F (37.6 C)  Resp: 17   Filed Vitals:   12/26/14 1024  Height: 5' 4.5" (1.638 m)  Weight: 235  lb (106.595 kg)   Body mass index is 39.73 kg/(m^2). Ideal Body Weight: Weight in (lb) to have BMI = 25: 147.6  GEN: WDWN, NAD, Non-toxic, A & O x 3, obese, looks well HEENT: Atraumatic, Normocephalic. Neck supple. No masses, No LAD.  Bilateral TM wnl, oropharynx normal.  PEERL,EOMI.   Ears and Nose: No external deformity. CV: RRR, No M/G/R. No JVD. No thrill. No extra heart sounds. PULM: CTA B, no wheezes, crackles, rhonchi. No retractions. No resp. distress. No accessory muscle use. ABD: S, NT, ND, +BS. No rebound. No HSM.  Abdomen is benign EXTR: No c/c/e NEURO Normal gait.  PSYCH: Normally interactive. Conversant. Not depressed or anxious appearing.  Calm demeanor.   Given 400 mg of PO ibuprofen in clinic - temp to 99.4 Given zofran 4 mg and another 4 mg to take home with her for later.  She ate some crackers in clinic  Results for orders placed or performed in  visit on 12/26/14  POCT CBC  Result Value Ref Range   WBC 8.9 4.6 - 10.2 K/uL   Lymph, poc 2.0 0.6 - 3.4   POC LYMPH PERCENT 22.3 10 - 50 %L   MID (cbc) 0.5 0 - 0.9   POC MID % 5.2 0 - 12 %M   POC Granulocyte 6.5 2 - 6.9   Granulocyte percent 72.5 37 - 80 %G   RBC 5.05 4.04 - 5.48 M/uL   Hemoglobin 14.3 12.2 - 16.2 g/dL   HCT, POC 42.3 37.7 - 47.9 %   MCV 83.7 80 - 97 fL   MCH, POC 28.3 27 - 31.2 pg   MCHC 33.8 31.8 - 35.4 g/dL   RDW, POC 14.1 %   Platelet Count, POC 421 142 - 424 K/uL   MPV 6.5 0 - 99.8 fL  POCT glucose (manual entry)  Result Value Ref Range   POC Glucose 139 (A) 70 - 99 mg/dl   Assessment and Plan: Malaise - Plan: POCT CBC, POCT glucose (manual entry), ibuprofen (ADVIL,MOTRIN) tablet 400 mg  Nausea without vomiting - Plan: ondansetron (ZOFRAN-ODT) disintegrating tablet 8 mg  History of pancreatitis - Plan: Comprehensive metabolic panel, Lipase  Here today with malaise and aversion to eating/ mild nausea, but no vomiting or abd pain.  Suspect a viral infection given normal CBC. Will check CMP and lipase as she has a history of pancreatitis Elected not to do a flu test as she is self pay and would not change management Will plan to be in touch with her soon with the rest of her labs- she will let us know if getting worse.    Signed Lamar Blinks, MD  11/2- have called her a few times over the last several days- no answer, cannot LMOM.  Will send her a letter Asked her to come in for repeat BMP in a couple of weeks

## 2014-12-26 NOTE — Patient Instructions (Signed)
It appears that you have a viral infection of some sort- perhaps even the flu Rest, continue to drink plenty of fluids and try to eat frequent small snacks Medication such as ibuprofen and/ or tylenol will help with your fever and overall discomfort  We gave you 4 mg of zofran for nausea today- you can take the other pill later on today or tomorrow if needed Let me know if you do not feel better soon- I will be in touch with your other labs and will make sure that you do not have any sign of pancreatitis

## 2014-12-28 ENCOUNTER — Encounter: Payer: Self-pay | Admitting: Family Medicine

## 2014-12-28 NOTE — Addendum Note (Signed)
Addended by: Lamar Blinks C on: 12/28/2014 09:03 AM   Modules accepted: Orders

## 2015-02-03 ENCOUNTER — Encounter: Payer: Self-pay | Admitting: Family Medicine

## 2015-02-03 ENCOUNTER — Ambulatory Visit: Payer: Self-pay | Attending: Family Medicine | Admitting: Family Medicine

## 2015-02-03 VITALS — BP 128/80 | HR 88 | Temp 98.3°F | Resp 18 | Ht 64.0 in | Wt 238.2 lb

## 2015-02-03 DIAGNOSIS — K859 Acute pancreatitis without necrosis or infection, unspecified: Secondary | ICD-10-CM | POA: Insufficient documentation

## 2015-02-03 DIAGNOSIS — J0120 Acute ethmoidal sinusitis, unspecified: Secondary | ICD-10-CM | POA: Insufficient documentation

## 2015-02-03 DIAGNOSIS — R05 Cough: Secondary | ICD-10-CM | POA: Insufficient documentation

## 2015-02-03 DIAGNOSIS — Z79899 Other long term (current) drug therapy: Secondary | ICD-10-CM | POA: Insufficient documentation

## 2015-02-03 DIAGNOSIS — R509 Fever, unspecified: Secondary | ICD-10-CM | POA: Insufficient documentation

## 2015-02-03 DIAGNOSIS — I10 Essential (primary) hypertension: Secondary | ICD-10-CM | POA: Insufficient documentation

## 2015-02-03 MED ORDER — AMOXICILLIN 500 MG PO CAPS: 500.0000 mg | ORAL_CAPSULE | Freq: Three times a day (TID) | ORAL | Status: DC

## 2015-02-03 NOTE — Progress Notes (Signed)
   Subjective:    Patient ID: Doris White, female    DOB: 24-Jun-1950, 64 y.o.   MRN: [id number]  HPI 64 year old patient with a history of hypertension who comes into the clinic complaining of cough productive of yellowish sputum, postnasal drain, nasal congestion for the last 1 week and noticed a fever this morning. She is unable to use nasal decongestants she says but has been using Mucinex which makes her cough a lot more. Denies chest pains or shortness of breath  Past Medical History  Diagnosis Date  . Pancreatitis 2013  . Hypertension Dx 2013    History reviewed. No pertinent past surgical history.  Current Outpatient Prescriptions on File Prior to Visit  Medication Sig Dispense Refill  . amLODipine (NORVASC) 5 MG tablet Take 1 tablet (5 mg total) by mouth daily. 30 tablet 0  . hydrochlorothiazide (HYDRODIURIL) 25 MG tablet Take 1 tablet (25 mg total) by mouth daily. 30 tablet 0  . ibuprofen (ADVIL,MOTRIN) 600 MG tablet Take 1 tablet (600 mg total) by mouth every 8 (eight) hours as needed for moderate pain. 30 tablet 1   No current facility-administered medications on file prior to visit.     Review of Systems  Constitutional: Positive for fever (subbjective). Negative for activity change and appetite change.  HENT: Positive for postnasal drip, sinus pressure and sore throat.   Respiratory: Positive for cough. Negative for chest tightness, shortness of breath and wheezing.   Gastrointestinal: Negative for abdominal pain, constipation and abdominal distention.  Genitourinary: Negative.   Musculoskeletal: Negative.   Psychiatric/Behavioral: Negative for behavioral problems and dysphoric mood.       Objective: Filed Vitals:   02/03/15 1237  BP: 128/80  Pulse: 88  Temp: 98.3 F (36.8 C)  Resp: 18  Height: 5\' 4"  (1.626 m)  Weight: 238 lb 3.2 oz (108.047 kg)  SpO2: 95%      Physical Exam  Constitutional: She is oriented to person, place, and time. She appears  well-developed and well-nourished.  Cardiovascular: Normal rate, normal heart sounds and intact distal pulses.   No murmur heard. Pulmonary/Chest: Effort normal and breath sounds normal. She has no wheezes. She has no rales. She exhibits no tenderness.  Abdominal: Soft. Bowel sounds are normal. She exhibits no distension and no mass. There is no tenderness.  Musculoskeletal: Normal range of motion.  Neurological: She is alert and oriented to person, place, and time.          Assessment & Plan:  Sinusitis: Treated with amoxicillin. Patient advised to continue using Mucinex and Tylenol as an analgesic. Increase oral fluid intake

## 2015-02-03 NOTE — Progress Notes (Signed)
Patient complains of not feeling well for over a week  Has cough congestion runny nose headache from coughing Patient also states she has been coughing up thick sticky yellow mucous And had a slight fever this am

## 2015-02-03 NOTE — Patient Instructions (Signed)

## 2015-04-14 ENCOUNTER — Ambulatory Visit: Payer: Self-pay | Attending: Family Medicine | Admitting: *Deleted

## 2015-04-14 VITALS — BP 131/85 | HR 70 | Temp 98.0°F | Resp 20 | Ht 64.0 in | Wt 239.0 lb

## 2015-04-14 DIAGNOSIS — Z0000 Encounter for general adult medical examination without abnormal findings: Secondary | ICD-10-CM

## 2015-04-14 DIAGNOSIS — Z23 Encounter for immunization: Secondary | ICD-10-CM | POA: Insufficient documentation

## 2015-04-14 NOTE — Patient Instructions (Signed)
Thank you for visiting the office today. Please call if you have any Flu like symptoms

## 2015-04-14 NOTE — Progress Notes (Signed)
Patient presents for the flu shot.  Patient tolerated the flu shot well.

## 2017-02-11 ENCOUNTER — Encounter (HOSPITAL_COMMUNITY): Payer: Self-pay

## 2017-02-11 ENCOUNTER — Other Ambulatory Visit: Payer: Self-pay

## 2017-02-11 ENCOUNTER — Emergency Department (HOSPITAL_COMMUNITY)
Admission: EM | Admit: 2017-02-11 | Discharge: 2017-02-11 | Disposition: A | Discharge status: Home / Self Care | Payer: Medicare Other | Attending: Emergency Medicine | Admitting: Emergency Medicine

## 2017-02-11 DIAGNOSIS — K5641 Fecal impaction: Secondary | ICD-10-CM | POA: Insufficient documentation

## 2017-02-11 DIAGNOSIS — K5900 Constipation, unspecified: Secondary | ICD-10-CM | POA: Diagnosis present

## 2017-02-11 DIAGNOSIS — I10 Essential (primary) hypertension: Secondary | ICD-10-CM | POA: Diagnosis not present

## 2017-02-11 MED ORDER — MILK AND MOLASSES ENEMA
1.0000 | Freq: Once | RECTAL | Status: AC
  Administered 2017-02-11: 250 mL via RECTAL
  Filled 2017-02-11: qty 250

## 2017-02-11 MED ORDER — LIDOCAINE HCL 2 % EX GEL
1.0000 "application " | Freq: Once | CUTANEOUS | Status: AC
  Administered 2017-02-11: 1
  Filled 2017-02-11: qty 5

## 2017-02-11 NOTE — ED Provider Notes (Signed)
[facility] DEPT Provider Note   CSN: [id number] Arrival date & time: 02/11/17  1328     History   Chief Complaint No chief complaint on file.   HPI   Blood pressure (!) 142/72, pulse 67, temperature 98.1 F (36.7 C), temperature source Oral, resp. rate 18, height 5\' 3"  (1.6 m), weight 102.1 kg (225 lb), SpO2 100 %.  Doris White is a 66 y.o. female complaining of constipation and inability to have a bowel movement for 3 days.  She is tried a [employer]ch[employer], retail and MiraLAX at home with little relief.  She sent to the ED from a equal walk-in clinic.  She denies fevers, chills, abdominal pain, emesis, history of prior abdominal surgeries, history of bowel obstructions.  She states that she is passing flatus normally she feels a pressure in the rectum.  They attempted disimpaction at [facility] walk-in clinic with little relief.  Past Medical History:  Diagnosis Date  . Hypertension Dx 2013  . Pancreatitis 2013    Patient Active Problem List   Diagnosis Date Noted  . Morbid obesity with BMI of 40.0-44.9, adult ([place]) 07/04/2014  . HTN (hypertension) 07/04/2014  . Low back pain with left-sided sciatica 07/04/2014    History reviewed. No pertinent surgical history.  OB History    No data available       Home Medications    Prior to Admission medications   Medication Sig Start Date End Date Taking? Authorizing Provider  amLODipine (NORVASC) 5 MG tablet Take 1 tablet (5 mg total) by mouth daily. 03/24/14  Yes Domenic Moras, PA-C  hydrochlorothiazide (HYDRODIURIL) 25 MG tablet Take 1 tablet (25 mg total) by mouth daily. 03/24/14  Yes Domenic Moras, PA-C  TURMERIC PO Take 1 capsule by mouth daily.   Yes [provider]  amoxicillin (AMOXIL) 500 MG capsule Take 1 capsule (500 mg total) by mouth 3 (three) times daily. Patient not taking: Reported on 04/14/2015 02/03/15   Arnoldo Morale, MD  diclofenac sodium (VOLTAREN) 1 % GEL Apply 1 application topically 2  (two) times daily as needed. 01/02/17   [provider]  ibuprofen (ADVIL,MOTRIN) 600 MG tablet Take 1 tablet (600 mg total) by mouth every 8 (eight) hours as needed for moderate pain. Patient not taking: Reported on 02/11/2017 07/04/14   Boykin Nearing, MD    Family History Family History  Problem Relation Age of Onset  . Diabetes Mother   . Heart failure Father   . COPD Father     Social History Social History   Tobacco Use  . Smoking status: Never Smoker  . Smokeless tobacco: Never Used  Substance Use Topics  . Alcohol use: Yes  . Drug use: No     Allergies   Erythromycin; Lisinopril; and Other   Review of Systems Review of Systems  A complete review of systems was obtained and all systems are negative except as noted in the HPI and PMH. '  Physical Exam Updated Vital Signs BP (!) 142/72   Pulse 67   Temp 98.1 F (36.7 C) (Oral)   Resp 18   Ht 5\' 3"  (1.6 m)   Wt 102.1 kg (225 lb)   SpO2 100%   BMI 39.86 kg/m   Physical Exam  Constitutional: She is oriented to person, place, and time. She appears well-developed and well-nourished. No distress.  HENT:  Head: Normocephalic and atraumatic.  Mouth/Throat: Oropharynx is clear and moist.  Eyes: Conjunctivae and EOM are normal. Pupils are  equal, round, and reactive to light.  Neck: Normal range of motion.  Cardiovascular: Normal rate, regular rhythm and intact distal pulses.  Pulmonary/Chest: Effort normal and breath sounds normal.  Abdominal: Soft. There is no tenderness.  Genitourinary:  Genitourinary Comments: Disimpaction is chaperoned by nurse: No rashes or lesions, firm stool in the rectal vault, portion removed manually.  Musculoskeletal: Normal range of motion.  Neurological: She is alert and oriented to person, place, and time.  Skin: She is not diaphoretic.  Psychiatric: She has a normal mood and affect.  Nursing note and vitals reviewed.    ED Treatments / Results  Labs (all labs  ordered are listed, but only abnormal results are displayed) Labs Reviewed - No data to display  EKG  EKG Interpretation None       Radiology No results found.  Procedures Fecal disimpaction Date/Time: 02/11/2017 3:28 PM Performed by: Monico Blitz, PA-C Authorized by: Monico Blitz, PA-C  Consent: Verbal consent obtained. Consent given by: patient Patient identity confirmed: verbally with patient Preparation: Patient was prepped and draped in the usual sterile fashion. Local anesthesia used: no  Anesthesia: Local anesthesia used: no  Sedation: Patient sedated: no  Patient tolerance: Patient tolerated the procedure well with no immediate complications    (including critical care time)  Medications Ordered in ED Medications  lidocaine (XYLOCAINE) 2 % jelly 1 application (1 application Other Given 02/11/17 1507)  milk and molasses enema (250 mLs Rectal Given 02/11/17 1641)     Initial Impression / Assessment and Plan / ED Course  I have reviewed the triage vital signs and the nursing notes.  Pertinent labs & imaging results that were available during my care of the patient were reviewed by me and considered in my medical decision making (see chart for details).     Vitals:   02/11/17 1410 02/11/17 1411 02/11/17 1413  BP:   (!) 142/72  Pulse: 67    Resp: 18    Temp: 98.1 F (36.7 C)    TempSrc: Oral    SpO2: 100%    Weight:  102.1 kg (225 lb)   Height:  5\' 3"  (1.6 m)     Medications  lidocaine (XYLOCAINE) 2 % jelly 1 application (1 application Other Given 02/11/17 1507)  milk and molasses enema (250 mLs Rectal Given 02/11/17 1641)    Doris White is 66 y.o. female presenting with impaction, has not had a bowel movement several days.  Was sent by primary care after unsuccessful disimpaction.  I have attempted disimpaction with little relief.  Give milk of molasses enema.  Patient with significant relief after milk of molasses enema.    Evaluation does not show pathology that would require ongoing emergent intervention or inpatient treatment. Pt is hemodynamically stable and mentating appropriately. Discussed findings and plan with patient/guardian, who agrees with care plan. All questions answered. Return precautions discussed and outpatient follow up given.    Final Clinical Impressions(s) / ED Diagnoses   Final diagnoses:  Fecal impaction [facility])    ED Discharge Orders    None       Waynetta Pean 02/11/17 1751    Daleen Bo, MD 02/13/17 [phone]

## 2017-02-11 NOTE — Discharge Instructions (Signed)
Please follow with your primary care doctor in the next 2 days for a check-up. They must obtain records for further management.  ° °Do not hesitate to return to the Emergency Department for any new, worsening or concerning symptoms.  ° °

## 2017-02-11 NOTE — ED Triage Notes (Signed)
Pt defecated moderate amount of soft formed stool.

## 2017-02-11 NOTE — ED Triage Notes (Signed)
Pt sent by PCP from [facility] [employer]. Pt reports that she has not had a BM x 4 days. Pt reports that she has taken miralax and MD attempted to ,manuel remove stool, but was unable to. Pt denies any abdominal pain.

## 2017-02-11 NOTE — ED Triage Notes (Signed)
Digital inspection via rectum completed by PA

## 2017-02-16 ENCOUNTER — Emergency Department (HOSPITAL_COMMUNITY): Payer: Medicare Other

## 2017-02-16 ENCOUNTER — Encounter (HOSPITAL_COMMUNITY): Payer: Self-pay | Admitting: Emergency Medicine

## 2017-02-16 ENCOUNTER — Emergency Department (HOSPITAL_BASED_OUTPATIENT_CLINIC_OR_DEPARTMENT_OTHER)
Admit: 2017-02-16 | Discharge: 2017-02-16 | Disposition: A | Discharge status: Home / Self Care | Payer: Medicare Other | Attending: Emergency Medicine | Admitting: Emergency Medicine

## 2017-02-16 ENCOUNTER — Emergency Department (HOSPITAL_COMMUNITY)
Admission: EM | Admit: 2017-02-16 | Discharge: 2017-02-16 | Disposition: A | Discharge status: Home / Self Care | Payer: Medicare Other | Attending: Emergency Medicine | Admitting: Emergency Medicine

## 2017-02-16 DIAGNOSIS — M7989 Other specified soft tissue disorders: Secondary | ICD-10-CM | POA: Diagnosis not present

## 2017-02-16 DIAGNOSIS — M25562 Pain in left knee: Secondary | ICD-10-CM | POA: Diagnosis present

## 2017-02-16 DIAGNOSIS — M1712 Unilateral primary osteoarthritis, left knee: Secondary | ICD-10-CM | POA: Diagnosis not present

## 2017-02-16 DIAGNOSIS — M79609 Pain in unspecified limb: Secondary | ICD-10-CM | POA: Diagnosis not present

## 2017-02-16 MED ORDER — PREDNISONE 20 MG PO TABS: ORAL_TABLET | ORAL | 0 refills | Status: DC

## 2017-02-16 MED ORDER — PREDNISONE 20 MG PO TABS
60.0000 mg | ORAL_TABLET | Freq: Once | ORAL | Status: AC
  Administered 2017-02-16: 60 mg via ORAL
  Filled 2017-02-16: qty 3

## 2017-02-16 MED ORDER — OXYCODONE-ACETAMINOPHEN 5-325 MG PO TABS: 1.0000 | ORAL_TABLET | Freq: Four times a day (QID) | ORAL | 0 refills | Status: DC | PRN

## 2017-02-16 MED ORDER — KETOROLAC TROMETHAMINE 15 MG/ML IJ SOLN
15.0000 mg | Freq: Once | INTRAMUSCULAR | Status: AC
Start: 2017-02-16 — End: 2017-02-16
  Administered 2017-02-16: 15 mg via INTRAMUSCULAR
  Filled 2017-02-16: qty 1

## 2017-02-16 MED ORDER — METHOCARBAMOL 500 MG PO TABS: 500.0000 mg | ORAL_TABLET | Freq: Three times a day (TID) | ORAL | 0 refills | Status: AC | PRN

## 2017-02-16 NOTE — ED Notes (Signed)
ED Provider at bedside. 

## 2017-02-16 NOTE — Progress Notes (Addendum)
VASCULAR LAB PRELIMINARY  PRELIMINARY  PRELIMINARY  PRELIMINARY  Left lower extremity venous duplex completed.    Preliminary report:  There is no DVT, SVT, or Baker's cyst noted in the left lower extremity.   Gave report to Dr. Fleeta Emmer, [facility], RVT 02/16/2017, 7:09 PM

## 2017-02-16 NOTE — ED Triage Notes (Addendum)
Patient c/o left lateral knee pain with swelling x1 week. Denies injury. Ambulatory with cane. Reports pain worsens with movement.

## 2017-02-16 NOTE — Discharge Instructions (Signed)
Take prednisone as prescribed.   Take robaxin for spasms.   Take percocet for severe pain. Do NOT drive with it. You may get more constipated with it so please take your colace   See your orthopedic doctor  Return to ER if you have worse knee pain or swelling, fever, unable to walk.

## 2017-02-16 NOTE — ED Notes (Signed)
US at bedside

## 2017-02-16 NOTE — ED Provider Notes (Signed)
[facility] DEPT Provider Note   CSN: [id number] Arrival date & time: 02/16/17  1512     History   Chief Complaint Chief Complaint  Patient presents with  . Knee Pain    HPI Doris White is a 66 y.o. female history of hypertension, degenerative joint disease, here presenting with left knee pain and swelling.  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  She states that for the last week or so, she has worsening left knee pain and swelling.  Denies any fall or injury.  Patient states that she has more pain when she tries to bear weight on it but is still able to walk on the leg.  She does walk with a cane.  Denies any fevers or chills.  She states that her left leg is more swollen as well but denies any chest pain or shortness of breath.  Patient was seen in the ED several days ago for stool impaction and was given an enema and now is taking Colace.  The history is provided by the patient.    Past Medical History:  Diagnosis Date  . Hypertension Dx 2013  . Pancreatitis 2013    Patient Active Problem List   Diagnosis Date Noted  . Morbid obesity with BMI of 40.0-44.9, adult ([place]) 07/04/2014  . HTN (hypertension) 07/04/2014  . Low back pain with left-sided sciatica 07/04/2014    History reviewed. No pertinent surgical history.  OB History    No data available       Home Medications    Prior to Admission medications   Medication Sig Start Date End Date Taking? Authorizing Provider  amLODipine (NORVASC) 5 MG tablet Take 1 tablet (5 mg total) by mouth daily. 03/24/14  Yes Domenic Moras, PA-C  docusate sodium (COLACE) 100 MG capsule Take 100 mg by mouth daily.   Yes [provider]  hydrochlorothiazide (HYDRODIURIL) 25 MG tablet Take 1 tablet (25 mg total) by mouth daily. 03/24/14  Yes Domenic Moras, PA-C  Menthol, Topical Analgesic, (BIOFREEZE) 4 % GEL Apply 1 application topically daily as needed (pain).    Yes [provider]  TURMERIC PO Take 1 capsule by mouth daily.   Yes [provider]  amoxicillin (AMOXIL) 500 MG capsule Take 1 capsule (500 mg total) by mouth 3 (three) times daily. Patient not taking: Reported on 04/14/2015 02/03/15   Arnoldo Morale, MD  diclofenac sodium (VOLTAREN) 1 % GEL Apply 1 application topically 2 (two) times daily as needed. 01/02/17   [provider]  ibuprofen (ADVIL,MOTRIN) 600 MG tablet Take 1 tablet (600 mg total) by mouth every 8 (eight) hours as needed for moderate pain. Patient not taking: Reported on 02/11/2017 07/04/14   Boykin Nearing, MD    Family History Family History  Problem Relation Age of Onset  . Diabetes Mother   . Heart failure Father   . COPD Father     Social History Social History   Tobacco Use  . Smoking status: Never Smoker  . Smokeless tobacco: Never Used  Substance Use Topics  . Alcohol use: Yes  . Drug use: No     Allergies   Erythromycin; Lisinopril; and Other   Review of Systems Review of Systems  Musculoskeletal:       L knee pain   All other systems reviewed and are negative.    Physical Exam Updated Vital Signs BP 127/63 (BP Location: Right Arm)   Pulse  62   Temp 98.1 F (36.7 C)   Resp 20   SpO2 98%   Physical Exam  Constitutional: She is oriented to person, place, and time. She appears well-developed.  HENT:  Head: Normocephalic.  Mouth/Throat: Oropharynx is clear and moist.  Eyes: Conjunctivae and EOM are normal. Pupils are equal, round, and reactive to light.  Neck: Normal range of motion. Neck supple.  Cardiovascular: Normal rate, regular rhythm and normal heart sounds.  Pulmonary/Chest: Effort normal and breath sounds normal. No stridor. No respiratory distress.  Abdominal: Soft. Bowel sounds are normal. She exhibits no distension. There is no tenderness.  Musculoskeletal:  L knee slightly effusion, dec ROM due to pain. ? Mild posterior knee tenderness. No obvious  pedal edema   Neurological: She is alert and oriented to person, place, and time.  Skin: Skin is warm.  Psychiatric: She has a normal mood and affect.  Nursing note and vitals reviewed.    ED Treatments / Results  Labs (all labs ordered are listed, but only abnormal results are displayed) Labs Reviewed - No data to display  EKG  EKG Interpretation None       Radiology Dg Knee Complete 4 Views Left  Result Date: 02/16/2017 CLINICAL DATA:  Medial left knee pain and swelling 1 week. No trauma. EXAM: LEFT KNEE - COMPLETE 4+ VIEW COMPARISON:  None. FINDINGS: There are mild-to-moderate tricompartmental osteoarthritic changes. No definite joint effusion. No evidence of fracture or dislocation. IMPRESSION: No acute findings. 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 Electronically Signed   By: Marin Olp M.D.   On: 02/16/2017 16:53    Procedures Procedures (including critical care time)  Medications Ordered in ED Medications  ketorolac (TORADOL) 15 MG/ML injection 15 mg (not administered)  predniSONE (DELTASONE) tablet 60 mg (not administered)     Initial Impression / Assessment and Plan / ED Course  I have reviewed the triage vital signs and the nursing notes.  Pertinent labs & imaging results that were available during my care of the patient were reviewed by me and considered in my medical decision making (see chart for details).    Doris White is a 66 y.o. female here with L knee pain. Mild effusion on exam. Afebrile. Well appearing. I doubt septic joint or gout. Likely joint effusion from arthritis vs bakers cyst, less likely DVT. Will get xrays, DVT study.  7:30 PM Xray showed severe arthritis. DVT study neg, no baker's cyst. Patient states that she had prednisone before for arthritis of her back. Will try prednisone, short course of percocet, muscle relaxants (per patient request). She had recent stool impaction so I told her to continue to take her  colace. She follows up with [facility] ortho and told her to make an appointment with the office.    Final Clinical Impressions(s) / ED Diagnoses   Final diagnoses:  None    ED Discharge Orders    None       Drenda Freeze, MD 02/16/17 1932

## 2017-03-10 ENCOUNTER — Encounter (HOSPITAL_COMMUNITY): Payer: Self-pay

## 2017-03-10 ENCOUNTER — Ambulatory Visit (HOSPITAL_COMMUNITY)
Admission: EM | Admit: 2017-03-10 | Discharge: 2017-03-10 | Disposition: A | Discharge status: Home / Self Care | Payer: Medicare Other | Source: Home / Self Care

## 2017-03-10 ENCOUNTER — Other Ambulatory Visit: Payer: Self-pay

## 2017-03-10 ENCOUNTER — Emergency Department (HOSPITAL_COMMUNITY)
Admission: EM | Admit: 2017-03-10 | Discharge: 2017-03-10 | Disposition: A | Discharge status: Home / Self Care | Payer: Medicare Other | Attending: Emergency Medicine | Admitting: Emergency Medicine

## 2017-03-10 DIAGNOSIS — Z79899 Other long term (current) drug therapy: Secondary | ICD-10-CM | POA: Diagnosis not present

## 2017-03-10 DIAGNOSIS — R110 Nausea: Secondary | ICD-10-CM | POA: Diagnosis not present

## 2017-03-10 DIAGNOSIS — I10 Essential (primary) hypertension: Secondary | ICD-10-CM | POA: Insufficient documentation

## 2017-03-10 DIAGNOSIS — R112 Nausea with vomiting, unspecified: Secondary | ICD-10-CM | POA: Diagnosis not present

## 2017-03-10 DIAGNOSIS — R42 Dizziness and giddiness: Secondary | ICD-10-CM | POA: Insufficient documentation

## 2017-03-10 HISTORY — DX: Unspecified osteoarthritis, unspecified site: M19.90

## 2017-03-10 LAB — CBG MONITORING, ED: GLUCOSE-CAPILLARY: 109 mg/dL — AB (ref 65–99)

## 2017-03-10 LAB — URINALYSIS, ROUTINE W REFLEX MICROSCOPIC
BILIRUBIN URINE: NEGATIVE
Glucose, UA: NEGATIVE mg/dL
HGB URINE DIPSTICK: NEGATIVE
Ketones, ur: NEGATIVE mg/dL
Leukocytes, UA: NEGATIVE
NITRITE: NEGATIVE
PROTEIN: NEGATIVE mg/dL
SPECIFIC GRAVITY, URINE: 1.01 (ref 1.005–1.030)
pH: 7 (ref 5.0–8.0)

## 2017-03-10 LAB — I-STAT TROPONIN, ED: TROPONIN I, POC: 0 ng/mL (ref 0.00–0.08)

## 2017-03-10 LAB — BASIC METABOLIC PANEL
ANION GAP: 9 (ref 5–15)
BUN: 5 mg/dL — ABNORMAL LOW (ref 6–20)
CALCIUM: 9.5 mg/dL (ref 8.9–10.3)
CO2: 27 mmol/L (ref 22–32)
CREATININE: 0.62 mg/dL (ref 0.44–1.00)
Chloride: 102 mmol/L (ref 101–111)
GFR calc non Af Amer: >60 mL/min (ref >60)
Glucose, Bld: 114 mg/dL — ABNORMAL HIGH (ref 65–99)
Potassium: 3.8 mmol/L (ref 3.5–5.1)
SODIUM: 138 mmol/L (ref 135–145)

## 2017-03-10 LAB — CBC
HCT: 44.8 % (ref 36.0–46.0)
HEMOGLOBIN: 15.1 g/dL — AB (ref 12.0–15.0)
MCH: 30.1 pg (ref 26.0–34.0)
MCHC: 33.7 g/dL (ref 30.0–36.0)
MCV: 89.4 fL (ref 78.0–100.0)
PLATELETS: 336 10*3/uL (ref 150–400)
RBC: 5.01 MIL/uL (ref 3.87–5.11)
RDW: 14.3 % (ref 11.5–15.5)
WBC: 6.1 10*3/uL (ref 4.0–10.5)

## 2017-03-10 MED ORDER — ONDANSETRON HCL 4 MG PO TABS: 4.0000 mg | ORAL_TABLET | Freq: Four times a day (QID) | ORAL | 0 refills | Status: DC

## 2017-03-10 MED ORDER — MECLIZINE HCL 25 MG PO TABS: 25.0000 mg | ORAL_TABLET | Freq: Three times a day (TID) | ORAL | 0 refills | Status: AC | PRN

## 2017-03-10 NOTE — ED Triage Notes (Signed)
Per Pt, Pt is coming from home with complaints of dizziness that came on yesterday at home. Pt fell to the floor and was nauseous the rest of the day. Reports feeling "unsteady and about to be dizzy at any moment." Pt is alert and oriented x4 upon arrival.

## 2017-03-10 NOTE — ED Notes (Signed)
Pt ambulated with steady gait

## 2017-03-10 NOTE — ED Notes (Signed)
ED Provider at bedside. 

## 2017-03-10 NOTE — ED Provider Notes (Signed)
[facility] EMER[facility]ENC[facility] DEPARTMENT Provider Note   CSN: [id number] Arrival date & time: 03/10/17  1216     History   Chief Complaint Chief Complaint  Patient presents with  . Dizziness    HPI CECILA White is a 67 y.o. female.  HPI   Patient is a 67 year old female with a history of hypertension and arthritis presenting for dizziness and a sensation of being off balance since yesterday.  Patient reports that 24 hours ago she experienced a sudden episode where she turned her head to the left and felt that she was "pinching" and "tipping".  Patient reports that she lowered herself to the ground and this episode resolved, however she was nauseous the rest of the day and vomited once.  Patient caught herself when she lowered herself to the ground and did not hit her head.  Patient reports that she did not have any other these dramatic episodes, however she has persistently felt "queasy", anxious, as well as slightly off balance.  Patient has not had any gait abnormalities.  Patient denies any sensation of the room is spinning at present, and denies diplopia.  Patient denies any weakness or numbness in any of her extremities, difficulty speaking, or facial weakness.  Patient denies any fever or chills.  Patient denies any recent medication changes.  Past Medical History:  Diagnosis Date  . Arthritis   . Hypertension Dx 2013  . Pancreatitis 2013    Patient Active Problem List   Diagnosis Date Noted  . Morbid obesity with BMI of 40.0-44.9, adult ([place]) 07/04/2014  . HTN (hypertension) 07/04/2014  . Low back pain with left-sided sciatica 07/04/2014    History reviewed. No pertinent surgical history.  OB History    No data available       Home Medications    Prior to Admission medications   Medication Sig Start Date End Date Taking? Authorizing Provider  amLODipine (NORVASC) 5 MG tablet Take 1 tablet (5 mg total) by mouth daily. 03/24/14  Yes Domenic Moras, PA-C    diclofenac sodium (VOLTAREN) 1 % GEL Apply 1 application topically 2 (two) times daily as needed. 01/02/17  Yes [provider]  docusate sodium (COLACE) 100 MG capsule Take 100 mg by mouth daily.   Yes [provider]  hydrochlorothiazide (HYDRODIURIL) 25 MG tablet Take 1 tablet (25 mg total) by mouth daily. 03/24/14  Yes Domenic Moras, PA-C  Menthol, Topical Analgesic, (BIOFREEZE) 4 % GEL Apply 1 application topically daily as needed (pain).   Yes [provider]  ibuprofen (ADVIL,MOTRIN) 600 MG tablet Take 1 tablet (600 mg total) by mouth every 8 (eight) hours as needed for moderate pain. Patient not taking: Reported on 02/11/2017 07/04/14   Boykin Nearing, MD  meclizine (ANTIVERT) 25 MG tablet Take 1 tablet (25 mg total) by mouth 3 (three) times daily as needed for dizziness. 03/10/17   Langston Masker B, PA-C  methocarbamol (ROBAXIN) 500 MG tablet Take 1 tablet (500 mg total) by mouth every 8 (eight) hours as needed for muscle spasms. Patient not taking: Reported on 03/10/2017 02/16/17   Drenda Freeze, MD  ondansetron (ZOFRAN) 4 MG tablet Take 1 tablet (4 mg total) by mouth every 6 (six) hours. 03/10/17   Langston Masker B, PA-C  oxyCODONE-acetaminophen (PERCOCET) 5-325 MG tablet Take 1 tablet by mouth every 6 (six) hours as needed. Patient not taking: Reported on 03/10/2017 02/16/17   Drenda Freeze, MD    Family History Family History  Problem Relation Age of Onset  . Diabetes Mother   . Heart failure Father   . COPD Father     Social History Social History   Tobacco Use  . Smoking status: Never Smoker  . Smokeless tobacco: Never Used  Substance Use Topics  . Alcohol use: Yes  . Drug use: No     Allergies   Erythromycin; Lisinopril; and Other   Review of Systems Review of Systems  Constitutional: Negative for chills and fever.  HENT: Negative for congestion, rhinorrhea, sinus pain and sore throat.   Eyes: Negative for visual disturbance.   Respiratory: Negative for cough, chest tightness and shortness of breath.   Cardiovascular: Negative for chest pain, palpitations and leg swelling.  Gastrointestinal: Positive for nausea. Negative for abdominal pain and vomiting.  Musculoskeletal: Negative for back pain and myalgias.  Skin: Negative for rash.  Neurological: Positive for dizziness and light-headedness. Negative for syncope, weakness, numbness and headaches.  All other systems reviewed and are negative.    Physical Exam Updated Vital Signs BP 125/81   Pulse 71   Temp 97.9 F (36.6 C) (Oral)   Resp 17   Ht 5\' 3"  (1.6 m)   Wt 102.1 kg (225 lb)   SpO2 98%   BMI 39.86 kg/m   Physical Exam  Constitutional: She appears well-developed and well-nourished. No distress.  HENT:  Head: Normocephalic and atraumatic.  Mouth/Throat: Oropharynx is clear and moist.  External auditory canals demonstrate no vesicular lesions or erythema.  Tympanic membranes are clear with good bony land marks bilaterally.  Eyes: Conjunctivae and EOM are normal. Pupils are equal, round, and reactive to light.  Neck: Normal range of motion. Neck supple.  Cardiovascular: Normal rate, regular rhythm, S1 normal and S2 normal.  No murmur heard. Pulmonary/Chest: Effort normal and breath sounds normal. She has no wheezes. She has no rales.  Abdominal: Soft. She exhibits no distension. There is no tenderness. There is no guarding.  Musculoskeletal: Normal range of motion. She exhibits no edema or deformity.  Lymphadenopathy:    She has no cervical adenopathy.  Neurological: She is alert.  HINTS Exam: Patient demonstrates no horizontal or vertical nystagmus with examination of extraocular muscles.  Negative test of skew.  Patient fixates on nose with head impulse testing (patient not actively vertiginous at time of exam).  Mental Status:  Alert, oriented, thought content appropriate, able to give a coherent history. Speech fluent without evidence of  aphasia. Able to follow 2 step commands without difficulty.  Cranial Nerves:  II:  Peripheral visual fields grossly normal, pupils equal, round, reactive to light III,IV, VI: ptosis not present, extra-ocular motions intact bilaterally  V,VII: smile symmetric, facial light touch sensation equal VIII: hearing grossly normal to voice  X: uvula elevates symmetrically  XI: bilateral shoulder shrug symmetric and strong XII: midline tongue extension without fassiculations Motor:  Normal tone. 5/5 in upper and lower extremities bilaterally including strong and equal grip strength and dorsiflexion/plantar flexion Sensory: Pinprick and light touch normal in all extremities.  Deep Tendon Reflexes: 2+ and symmetric in the biceps and patella. No clonus. Cerebellar: normal finger-to-nose with bilateral upper extremities Gait: normal gait and balance Stance: No pronator drift and good coordination, strength, and position sense with tapping of bilateral arms (performed in sitting position).  Skin: Skin is warm and dry. No rash noted. No erythema.  Psychiatric: She has a normal mood and affect. Her behavior is normal. Judgment and thought content normal.  Nursing note and vitals  reviewed.    ED Treatments / Results  Labs (all labs ordered are listed, but only abnormal results are displayed) Labs Reviewed  BASIC METABOLIC PANEL - Abnormal; Notable for the following components:      Result Value   Glucose, Bld 114 (*)    BUN 5 (*)    All other components within normal limits  CBC - Abnormal; Notable for the following components:   Hemoglobin 15.1 (*)    All other components within normal limits  CBG MONITORING, ED - Abnormal; Notable for the following components:   Glucose-Capillary 109 (*)    All other components within normal limits  URINALYSIS, ROUTINE W REFLEX MICROSCOPIC  I-STAT TROPONIN, ED    EKG  EKG Interpretation  Date/Time:  Monday March 10 2017 12:35:35 EST Ventricular Rate:    73 PR Interval:  168 QRS Duration: 94 QT Interval:  402 QTC Calculation: 442 R Axis:   40 Text Interpretation:  Normal sinus rhythm Cannot rule out Anterior infarct , age undetermined Abnormal ECG No STEMI.  Confirmed by Nanda Quinton [id number]) on 03/10/2017 5:01:23 PM       Radiology No results found.  Procedures Procedures (including critical care time)  Medications Ordered in ED Medications - No data to display   Initial Impression / Assessment and Plan / ED Course  I have reviewed the triage vital signs and the nursing notes.  Pertinent labs & imaging results that were available during my care of the patient were reviewed by me and considered in my medical decision making (see chart for details).    Final Clinical Impressions(s) / ED Diagnoses   Final diagnoses:  Dizziness  Nausea   Patient is nontoxic-appearing, afebrile, and neurologically intact.  Patient is not actively vertiginous at the time of examination, but HINTS exam demonstrates no abnormalities.  I have low suspicion for posterior circulation abnormality as the cause of patient's episode yesterday.  Patient's episode was discrete and positional, therefore suspect benign positional vertigo.  Patient was independently examined by Dr. Nanda Quinton, who is agrees.  Patient is very anxious about her symptoms, which may be contributing to overall sensations.  Patient has no further worrisome sensations for central cause of vertigo.  Patient discharged with trial of meclizine and Zofran.  Patient instructed to follow-up with ear nose and throat.  Patient given strict return precautions for any non-resolving vertiginous episodes, blurred vision, neck pain, weakness or numbness on one side, difficulty speaking, or facial weakness.  Patient is in understanding and agrees with the plan of care.  This is a shared visit with Dr. Nanda Quinton. Patient was independently evaluated by this attending physician. Attending physician  consulted in evaluation and discharge management.  ED Discharge Orders        Ordered    meclizine (ANTIVERT) 25 MG tablet  3 times daily PRN     03/10/17 1705    ondansetron (ZOFRAN) 4 MG tablet  Every 6 hours     03/10/17 1706       Tamala Julian 03/10/17 2329    Margette Fast, MD 03/11/17 [phone]

## 2017-03-10 NOTE — ED Notes (Signed)
Pt verbalized understanding discharge instructions and denies any further needs or questions at this time. VS stable, ambulatory and steady gait.   

## 2017-03-10 NOTE — ED Notes (Addendum)
Reports an episode of severe dizziness yesterday that made her fall to the floor.  Denies injury with fall.  No loc. Patient denies any pain before or since episode.  No more episodes of dizziness.  Patient concerned this may happen again.  No specific weakness in any extremity.  Patient is anxious.  Spoke to dr Marcille Blanco and amy yu, pa. Both feels ED is the best location, patient agreed.  Regular radial pulse

## 2017-03-10 NOTE — Discharge Instructions (Signed)
Please see the information and instructions below regarding your visit.  Your diagnoses today include:  1. Dizziness   2. 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  This often self resolves, however we would like you to follow-up with ear nose and throat for further rehabilitation of this problem.  Tests performed today include: See side panel of your discharge paperwork for testing performed today. Vital signs are listed at the bottom of these instructions.   Your blood work and EKG looked good today.  Medications prescribed:    Take any prescribed medications only as prescribed, and any over the counter medications only as directed on the packaging.  Meclizine.  This is a medicine to help with dizziness.  Please make sure that you are at home the first time you take it and only take 25 mg.  Please ensure that you are not driving or operating machinery prior to taking this medication.  Zofran.  This is an antinausea medication.  You may take this while driving, going to work, or Engineer, water.  Home care instructions:  Please follow any educational materials contained in this packet.   Follow-up instructions: Please follow-up with your primary care provider as soon as possible for further evaluation of your symptoms if they are not completely improved.   Please follow up with ear nose and throat.  Return instructions:  Please return to the Emergency Department if you experience worsening symptoms.  Please return for any worsening dizzy episodes, shortness of breath, chest pain, episodes that do not resolve, blurry vision or double vision, or weakness on one side your body or new or worsening symptoms.   Please return if you have any other emergent concerns.  Additional Information:   Your vital signs today were: BP 119/85 (BP Location: Right Arm)    Pulse 76    Temp 97.9 F (36.6 C) (Oral)     Resp 16    Ht 5\' 3"  (1.6 m)    Wt 102.1 kg (225 lb)    SpO2 97%    BMI 39.86 kg/m  If your blood pressure (BP) was elevated on multiple readings during this visit above 130 for the top number or above 80 for the bottom number, please have this repeated by your primary care provider within one month. --------------  Thank you for allowing [place] to participate in your care today.

## 2017-07-24 ENCOUNTER — Other Ambulatory Visit: Payer: Self-pay | Admitting: Family Medicine

## 2017-07-24 DIAGNOSIS — Z139 Encounter for screening, unspecified: Secondary | ICD-10-CM

## 2017-07-24 DIAGNOSIS — R5381 Other malaise: Secondary | ICD-10-CM

## 2017-07-31 ENCOUNTER — Other Ambulatory Visit: Payer: Self-pay | Admitting: Family Medicine

## 2017-07-31 DIAGNOSIS — E2839 Other primary ovarian failure: Secondary | ICD-10-CM

## 2019-11-18 ENCOUNTER — Emergency Department (HOSPITAL_COMMUNITY): Payer: Medicare Other

## 2019-11-18 ENCOUNTER — Emergency Department (HOSPITAL_COMMUNITY)
Admission: EM | Admit: 2019-11-18 | Discharge: 2019-11-18 | Disposition: A | Discharge status: Home / Self Care | Payer: Medicare Other | Attending: Emergency Medicine | Admitting: Emergency Medicine

## 2019-11-18 ENCOUNTER — Other Ambulatory Visit: Payer: Self-pay

## 2019-11-18 ENCOUNTER — Encounter (HOSPITAL_COMMUNITY): Payer: Self-pay | Admitting: Emergency Medicine

## 2019-11-18 DIAGNOSIS — F419 Anxiety disorder, unspecified: Secondary | ICD-10-CM | POA: Insufficient documentation

## 2019-11-18 DIAGNOSIS — I10 Essential (primary) hypertension: Secondary | ICD-10-CM | POA: Diagnosis not present

## 2019-11-18 DIAGNOSIS — T50905A Adverse effect of unspecified drugs, medicaments and biological substances, initial encounter: Secondary | ICD-10-CM

## 2019-11-18 DIAGNOSIS — R0789 Other chest pain: Secondary | ICD-10-CM

## 2019-11-18 DIAGNOSIS — Z79899 Other long term (current) drug therapy: Secondary | ICD-10-CM | POA: Insufficient documentation

## 2019-11-18 DIAGNOSIS — R6889 Other general symptoms and signs: Secondary | ICD-10-CM

## 2019-11-18 LAB — URINALYSIS, ROUTINE W REFLEX MICROSCOPIC
Bilirubin Urine: NEGATIVE
Glucose, UA: NEGATIVE mg/dL
Hgb urine dipstick: NEGATIVE
Ketones, ur: NEGATIVE mg/dL
Leukocytes,Ua: NEGATIVE
Nitrite: NEGATIVE
Protein, ur: NEGATIVE mg/dL
Specific Gravity, Urine: 1.017 (ref 1.005–1.030)
pH: 7 (ref 5.0–8.0)

## 2019-11-18 LAB — BASIC METABOLIC PANEL
Anion gap: 12 (ref 5–15)
BUN: 11 mg/dL (ref 8–23)
CO2: 27 mmol/L (ref 22–32)
Calcium: 8.9 mg/dL (ref 8.9–10.3)
Chloride: 98 mmol/L (ref 98–111)
Creatinine, Ser: 0.76 mg/dL (ref 0.44–1.00)
GFR calc Af Amer: >60 mL/min (ref >60)
GFR calc non Af Amer: >60 mL/min (ref >60)
Glucose, Bld: 127 mg/dL — ABNORMAL HIGH (ref 70–99)
Potassium: 3.4 mmol/L — ABNORMAL LOW (ref 3.5–5.1)
Sodium: 137 mmol/L (ref 135–145)

## 2019-11-18 LAB — CBC
HCT: 46.8 % — ABNORMAL HIGH (ref 36.0–46.0)
Hemoglobin: 15 g/dL (ref 12.0–15.0)
MCH: 28.7 pg (ref 26.0–34.0)
MCHC: 32.1 g/dL (ref 30.0–36.0)
MCV: 89.7 fL (ref 80.0–100.0)
Platelets: 359 10*3/uL (ref 150–400)
RBC: 5.22 MIL/uL — ABNORMAL HIGH (ref 3.87–5.11)
RDW: 14.1 % (ref 11.5–15.5)
WBC: 10.6 10*3/uL — ABNORMAL HIGH (ref 4.0–10.5)
nRBC: 0 % (ref 0.0–0.2)

## 2019-11-18 LAB — TROPONIN I (HIGH SENSITIVITY)
Troponin I (High Sensitivity): 5 ng/L (ref <18)
Troponin I (High Sensitivity): 7 ng/L (ref <18)

## 2019-11-18 NOTE — ED Triage Notes (Signed)
Pt states she was seen at PCP office 1 week ago for lower back pain and took Prednisone for 6 days.  Reports constipation x 3-4 days that has now improved.  While having large BM yesterday she had chest pressure.  Pt unsure if she is having any "pressure" at this time.  States she is also very anxious.  Denies SOB.  Also reports urinary frequency that started yesterday.

## 2019-11-18 NOTE — ED Provider Notes (Signed)
[facility] EMERGENCY DEPARTMENT Provider Note   CSN: [id number] Arrival date & time: 11/18/19  0845     History Chief Complaint  Patient presents with  . Chest Pain    Doris White is a 69 y.o. female who presents with a cc of chest pressure.  Patient has a multitude of complaints and concerns it is very worrisome to her.  She is on chronic opiate medication for her chronic back pain and frequently has constipation.  She is concerned because she has been constipated has had to have a fecal disimpaction before.  She has been making small bowel movements and passing gas but recently has had some increased constipation.  Yesterday the patient was trying to make a bowel movement and while she was straining began having chest pressure which made her very concerned.  This lasted approximately just a few seconds.  And then resolved.  She did not have any diaphoresis, nausea, shortness of breath.  She is concerned because she feels pressure in her lower abdomen and feels like she needs to urinate more frequently.  She has a history of hypertension she does not have a history of hypercholesterolemia she is not a smoker and has no known coronary artery disease.  She has tried an extra Colace to make her bowel movement easier.  She has recently been on prednisone for her lower back pain.  HPI  HPI: A 69 year old patient with a history of hypertension and obesity presents for evaluation of chest pain. Initial onset of pain was more than 6 hours ago. The patient's chest pain is described as heaviness/pressure/tightness and is not worse with exertion. The patient's chest pain is middle- or left-sided, is not well-localized, is not sharp and does not radiate to the arms/jaw/neck. The patient does not complain of nausea and denies diaphoresis. The patient has no history of stroke, has no history of peripheral artery disease, has not smoked in the past 90 days, denies any history of treated  diabetes, has no relevant family history of coronary artery disease (first degree relative at less than age 5) and has no history of hypercholesterolemia.   Past Medical History:  Diagnosis Date  . Arthritis   . Hypertension Dx 2013  . Pancreatitis 2013    Patient Active Problem List   Diagnosis Date Noted  . Morbid obesity with BMI of 40.0-44.9, adult ([place]) 07/04/2014  . HTN (hypertension) 07/04/2014  . Low back pain with left-sided sciatica 07/04/2014    History reviewed. No pertinent surgical history.   OB History   No obstetric history on file.     Family History  Problem Relation Age of Onset  . Diabetes Mother   . Heart failure Father   . COPD Father     Social History   Tobacco Use  . Smoking status: Never Smoker  . Smokeless tobacco: Never Used  Substance Use Topics  . Alcohol use: Yes  . Drug use: No    Home Medications Prior to Admission medications   Medication Sig Start Date End Date Taking? Authorizing Provider  amLODipine (NORVASC) 5 MG tablet Take 1 tablet (5 mg total) by mouth daily. 03/24/14   Domenic Moras, PA-C  diclofenac sodium (VOLTAREN) 1 % GEL Apply 1 application topically 2 (two) times daily as needed. 01/02/17   [provider]  docusate sodium (COLACE) 100 MG capsule Take 100 mg by mouth daily.    [provider]  hydrochlorothiazide (HYDRODIURIL) 25 MG tablet Take 1 tablet (  25 mg total) by mouth daily. 03/24/14   Domenic Moras, PA-C  ibuprofen (ADVIL,MOTRIN) 600 MG tablet Take 1 tablet (600 mg total) by mouth every 8 (eight) hours as needed for moderate pain. Patient not taking: Reported on 02/11/2017 07/04/14   Boykin Nearing, MD  meclizine (ANTIVERT) 25 MG tablet Take 1 tablet (25 mg total) by mouth 3 (three) times daily as needed for dizziness. 03/10/17   Langston Masker B, PA-C  Menthol, Topical Analgesic, (BIOFREEZE) 4 % GEL Apply 1 application topically daily as needed (pain).    [provider]  methocarbamol  (ROBAXIN) 500 MG tablet Take 1 tablet (500 mg total) by mouth every 8 (eight) hours as needed for muscle spasms. Patient not taking: Reported on 03/10/2017 02/16/17   Drenda Freeze, MD  ondansetron (ZOFRAN) 4 MG tablet Take 1 tablet (4 mg total) by mouth every 6 (six) hours. 03/10/17   Langston Masker B, PA-C  oxyCODONE-acetaminophen (PERCOCET) 5-325 MG tablet Take 1 tablet by mouth every 6 (six) hours as needed. Patient not taking: Reported on 03/10/2017 02/16/17   Drenda Freeze, MD    Allergies    Erythromycin, Lisinopril, and Other  Review of Systems   Review of Systems Ten systems reviewed and are negative for acute change, except as noted in the HPI.   Physical Exam Updated Vital Signs BP (!) 152/59   Pulse 67   Temp 98.6 F (37 C) (Oral)   Resp 17   Ht 5\' 3"  (1.6 m)   Wt 113.4 kg   SpO2 97%   BMI 44.29 kg/m   Physical Exam Vitals and nursing note reviewed.  Constitutional:      General: She is not in acute distress.    Appearance: She is well-developed. She is not diaphoretic.  HENT:     Head: Normocephalic and atraumatic.  Eyes:     General: No scleral icterus.    Conjunctiva/sclera: Conjunctivae normal.  Cardiovascular:     Rate and Rhythm: Normal rate and regular rhythm.     Heart sounds: Normal heart sounds. No murmur heard.  No friction rub. No gallop.   Pulmonary:     Effort: Pulmonary effort is normal. No respiratory distress.     Breath sounds: Normal breath sounds.  Abdominal:     General: Bowel sounds are normal. There is no distension.     Palpations: Abdomen is soft. There is no mass.     Tenderness: There is no abdominal tenderness. There is no guarding.  Musculoskeletal:     Cervical back: Normal range of motion.  Skin:    General: Skin is warm and dry.  Neurological:     Mental Status: She is alert and oriented to person, place, and time.  Psychiatric:        Mood and Affect: Mood is anxious.        Behavior: Behavior normal.      ED Results / Procedures / Treatments   Labs (all labs ordered are listed, but only abnormal results are displayed) Labs Reviewed  BASIC METABOLIC PANEL - Abnormal; Notable for the following components:      Result Value   Potassium 3.4 (*)    Glucose, Bld 127 (*)    All other components within normal limits  CBC - Abnormal; Notable for the following components:   WBC 10.6 (*)    RBC 5.22 (*)    HCT 46.8 (*)    All other components within normal limits  URINALYSIS, ROUTINE W  REFLEX MICROSCOPIC - Abnormal; Notable for the following components:   APPearance CLOUDY (*)    All other components within normal limits  TROPONIN I (HIGH SENSITIVITY)  TROPONIN I (HIGH SENSITIVITY)    EKG EKG Interpretation  Date/Time:  Thursday November 18 2019 08:43:15 EDT Ventricular Rate:  77 PR Interval:  164 QRS Duration: 90 QT Interval:  390 QTC Calculation: 441 R Axis:   76 Text Interpretation: Normal sinus rhythm Low voltage QRS Cannot rule out Anterior infarct , age undetermined Abnormal ECG Confirmed by Quintella Reichert [phone]) on 11/18/2019 12:38:52 PM   Radiology No results found.  Procedures Procedures (including critical care time)  Medications Ordered in ED Medications - No data to display  ED Course  I have reviewed the triage vital signs and the nursing notes.  Pertinent labs & imaging results that were available during my care of the patient were reviewed by me and considered in my medical decision making (see chart for details).    MDM Rules/Calculators/A&P HEAR Score: 4                        CC: multiple complaints/ chest pain VS: BP (!) 152/59   Pulse 67   Temp 98.6 F (37 C) (Oral)   Resp 17   Ht 5\' 3"  (1.6 m)   Wt 113.4 kg   SpO2 97%   BMI 44.29 kg/m   TF:TDDUKGU is gathered by patient and emr. Previous records obtained and reviewed. DDX:The patient's complaint of chest pain involves an extensive number of diagnostic and treatment options, and is a  complaint that carries with it a high risk of complications, morbidity, and potential mortality. Given the large differential diagnosis, medical decision making is of high complexity. The emergent differential diagnosis of chest pain includes: Acute coronary syndrome, pericarditis, aortic dissection, pulmonary embolism, tension pneumothorax, pneumonia, and esophageal rupture.  Labs: I ordered reviewed and interpreted labs which included CBC- shows mildly elevated  BMP- mildly elevated glucose on recent prednisone TROPONIN x 2- wnl UA- wnl  Imaging: I ordered and reviewed images which included 2 V CXR. I independently visualized and interpreted all imaging. There are no acute, significant findings on today's images. EKG: Normal sinus rhythm at a rate of 77 Consults: MDM: Patient here with multiple complaints, fleeting chest pressure.  I suspect that the majority of her symptoms are secondary to anxiety which may be underlying but has certainly been amplified in the setting of recent steroid use.  The patient seems to ruminate about every bodily sensation and is extremely worried about her urinary urgency.  Sugar is elevated which is likely due to her recent prednisone use, however may reflect underlying new diagnosis of diabetes and suggest that the patient should have follow-up labs with her PCP.  She has a low heart score and I have very low suspicion for ACS with fleeting chest pressure and two negative troponins.  The patient is still very worried about her urinary urgency.  I discussed that she does not appear to have any evidence of UTI and while she may have multiple symptoms she does not appear to have any life, limb, or organ threatening illness, or any issue which may lead to severe morbidity or mortality at this time.  I tried to discuss the scope of emergency medicine, while addressing with the patient's concerns.  Although the patient still has many concerns that do not have an answer today  they do not appear to be  emergent never tried to communicate to the patient that she should see her primary care doctor about all of her concerns today.  Although the patient seems somewhat dissatisfied, the patient appears otherwise appropriate for discharge at this time.  Advise that she not use steroids again as she seems to have adverse reaction to them.  Advised very close PCP follow-up and if she is concerned about her heart she may follow-up with cardiology after referral from PCP. Patient disposition:The patient appears reasonably screened and/or stabilized for discharge and I doubt any other medical condition or other [facility] requiring further screening, evaluation, or treatment in the ED at this time prior to discharge. I have discussed lab and/or imaging findings with the patient and answered all questions/concerns to the best of my ability.I have discussed return precautions and OP follow up.    Final Clinical Impression(s) / ED Diagnoses Final diagnoses:  Multiple complaints  Chest pressure  Anxiety  Adverse effect of drug, initial encounter    Rx / DC Orders ED Discharge Orders    None       Margarita Mail, PA-C 11/21/19 0949    Quintella Reichert, MD 11/21/19 1051

## 2019-11-18 NOTE — Discharge Instructions (Addendum)
Get help right away if: Your chest pain gets worse. You have a cough that gets worse, or you cough up blood. You have severe pain in your abdomen. You faint. You have sudden, unexplained chest discomfort. You have sudden, unexplained discomfort in your arms, back, neck, or jaw. You have shortness of breath at any time. You suddenly start to sweat, or your skin gets clammy. You feel nausea or you vomit. You suddenly feel lightheaded or dizzy. You have severe weakness, or unexplained weakness or fatigue. Your heart begins to beat quickly, or it feels like it is skipping beats. 

## 2020-03-09 DIAGNOSIS — E785 Hyperlipidemia, unspecified: Secondary | ICD-10-CM | POA: Diagnosis not present

## 2020-03-09 DIAGNOSIS — E039 Hypothyroidism, unspecified: Secondary | ICD-10-CM | POA: Diagnosis not present

## 2020-03-09 DIAGNOSIS — R7301 Impaired fasting glucose: Secondary | ICD-10-CM | POA: Diagnosis not present

## 2020-03-09 DIAGNOSIS — R42 Dizziness and giddiness: Secondary | ICD-10-CM | POA: Diagnosis not present

## 2020-04-26 DIAGNOSIS — E039 Hypothyroidism, unspecified: Secondary | ICD-10-CM | POA: Diagnosis not present

## 2020-04-26 DIAGNOSIS — R531 Weakness: Secondary | ICD-10-CM | POA: Diagnosis not present

## 2020-06-22 DIAGNOSIS — E039 Hypothyroidism, unspecified: Secondary | ICD-10-CM | POA: Diagnosis not present

## 2020-08-17 DIAGNOSIS — L918 Other hypertrophic disorders of the skin: Secondary | ICD-10-CM | POA: Diagnosis not present

## 2020-08-17 DIAGNOSIS — E039 Hypothyroidism, unspecified: Secondary | ICD-10-CM | POA: Diagnosis not present

## 2020-08-17 DIAGNOSIS — Z0000 Encounter for general adult medical examination without abnormal findings: Secondary | ICD-10-CM | POA: Diagnosis not present

## 2020-08-17 DIAGNOSIS — Z23 Encounter for immunization: Secondary | ICD-10-CM | POA: Diagnosis not present

## 2020-08-17 DIAGNOSIS — E785 Hyperlipidemia, unspecified: Secondary | ICD-10-CM | POA: Diagnosis not present

## 2020-08-17 DIAGNOSIS — I10 Essential (primary) hypertension: Secondary | ICD-10-CM | POA: Diagnosis not present

## 2020-08-17 DIAGNOSIS — I878 Other specified disorders of veins: Secondary | ICD-10-CM | POA: Diagnosis not present

## 2020-10-26 ENCOUNTER — Emergency Department (HOSPITAL_COMMUNITY): Payer: No Typology Code available for payment source

## 2020-10-26 ENCOUNTER — Emergency Department (HOSPITAL_COMMUNITY)
Admission: EM | Admit: 2020-10-26 | Discharge: 2020-10-26 | Disposition: A | Discharge status: Home / Self Care | Payer: No Typology Code available for payment source | Attending: Emergency Medicine | Admitting: Emergency Medicine

## 2020-10-26 ENCOUNTER — Encounter (HOSPITAL_COMMUNITY): Payer: Self-pay | Admitting: Emergency Medicine

## 2020-10-26 DIAGNOSIS — Z743 Need for continuous supervision: Secondary | ICD-10-CM | POA: Diagnosis not present

## 2020-10-26 DIAGNOSIS — I10 Essential (primary) hypertension: Secondary | ICD-10-CM | POA: Insufficient documentation

## 2020-10-26 DIAGNOSIS — S0990XA Unspecified injury of head, initial encounter: Secondary | ICD-10-CM | POA: Diagnosis not present

## 2020-10-26 DIAGNOSIS — M25562 Pain in left knee: Secondary | ICD-10-CM | POA: Insufficient documentation

## 2020-10-26 DIAGNOSIS — W01198A Fall on same level from slipping, tripping and stumbling with subsequent striking against other object, initial encounter: Secondary | ICD-10-CM | POA: Diagnosis not present

## 2020-10-26 DIAGNOSIS — Z79899 Other long term (current) drug therapy: Secondary | ICD-10-CM | POA: Diagnosis not present

## 2020-10-26 DIAGNOSIS — R609 Edema, unspecified: Secondary | ICD-10-CM | POA: Diagnosis not present

## 2020-10-26 DIAGNOSIS — S8002XA Contusion of left knee, initial encounter: Secondary | ICD-10-CM | POA: Insufficient documentation

## 2020-10-26 DIAGNOSIS — R404 Transient alteration of awareness: Secondary | ICD-10-CM | POA: Diagnosis not present

## 2020-10-26 DIAGNOSIS — Z8739 Personal history of other diseases of the musculoskeletal system and connective tissue: Secondary | ICD-10-CM | POA: Diagnosis not present

## 2020-10-26 DIAGNOSIS — S0083XA Contusion of other part of head, initial encounter: Secondary | ICD-10-CM | POA: Diagnosis not present

## 2020-10-26 DIAGNOSIS — R6889 Other general symptoms and signs: Secondary | ICD-10-CM | POA: Diagnosis not present

## 2020-10-26 NOTE — ED Notes (Signed)
An After Visit Summary was printed and given to the patient. Discharge instructions given and no further questions at this time.  

## 2020-10-26 NOTE — Discharge Instructions (Addendum)
Apply ice to help with swelling.  Take over-the-counter medications for pain as needed.  Follow-up with your orthopedic doctor if the knee pain does not resolve within the next couple of weeks

## 2020-10-26 NOTE — ED Triage Notes (Signed)
Per EMS, patient from work, tripped over stool falling on L knee hitting head on nearby table. Hematoma to forehead. Denies taking blood thinner. Denies LOC.

## 2020-10-26 NOTE — ED Provider Notes (Signed)
[facility] DEPT Provider Note   CSN: [id number] Arrival date & time: 10/26/20  1343     History Chief Complaint  Patient presents with   Owens Shark is a 70 y.o. female.   Fall    Pt was at work leaning over, when she was jostled and ended up losing her balance.  Patient ended up falling forward striking her left forehead on the desk, hitting her left knee on the ground and hitting her hand on a cabinet.  Patient did not lose consciousness but she developed a large area of swelling on her left forehead.  Patient also sustained a bruise on her left knee and is tender to the touch.  She denies any nausea vomiting.  No numbness or weakness.  Patient has been able to bear weight but her left knee is swollen.  Patient was very worried about the size of the swelling on her forehead.  She was concerned that she developed internal bleeding  Past Medical History:  Diagnosis Date   Arthritis    Hypertension Dx 2013   Pancreatitis 2013    Patient Active Problem List   Diagnosis Date Noted   Morbid obesity with BMI of 40.0-44.9, adult ([place]) 07/04/2014   HTN (hypertension) 07/04/2014   Low back pain with left-sided sciatica 07/04/2014    History reviewed. No pertinent surgical history.   OB History   No obstetric history on file.     Family History  Problem Relation Age of Onset   Diabetes Mother    Heart failure Father    COPD Father     Social History   Tobacco Use   Smoking status: Never   Smokeless tobacco: Never  Substance Use Topics   Alcohol use: Yes   Drug use: No    Home Medications Prior to Admission medications   Medication Sig Start Date End Date Taking? Authorizing Provider  amLODipine (NORVASC) 5 MG tablet Take 1 tablet (5 mg total) by mouth daily. 03/24/14   Domenic Moras, PA-C  diclofenac sodium (VOLTAREN) 1 % GEL Apply 1 application topically 2 (two) times daily as needed. 01/02/17   [provider]   docusate sodium (COLACE) 100 MG capsule Take 100 mg by mouth daily.    [provider]  hydrochlorothiazide (HYDRODIURIL) 25 MG tablet Take 1 tablet (25 mg total) by mouth daily. 03/24/14   Domenic Moras, PA-C  ibuprofen (ADVIL,MOTRIN) 600 MG tablet Take 1 tablet (600 mg total) by mouth every 8 (eight) hours as needed for moderate pain. Patient not taking: Reported on 02/11/2017 07/04/14   Boykin Nearing, MD  meclizine (ANTIVERT) 25 MG tablet Take 1 tablet (25 mg total) by mouth 3 (three) times daily as needed for dizziness. 03/10/17   Langston Masker B, PA-C  Menthol, Topical Analgesic, (BIOFREEZE) 4 % GEL Apply 1 application topically daily as needed (pain).    [provider]  methocarbamol (ROBAXIN) 500 MG tablet Take 1 tablet (500 mg total) by mouth every 8 (eight) hours as needed for muscle spasms. Patient not taking: Reported on 03/10/2017 02/16/17   Drenda Freeze, MD  ondansetron (ZOFRAN) 4 MG tablet Take 1 tablet (4 mg total) by mouth every 6 (six) hours. 03/10/17   Langston Masker B, PA-C  oxyCODONE-acetaminophen (PERCOCET) 5-325 MG tablet Take 1 tablet by mouth every 6 (six) hours as needed. Patient not taking: Reported on 03/10/2017 02/16/17   Drenda Freeze, MD    Allergies  Erythromycin, Lisinopril, and Other  Review of Systems   Review of Systems  All other systems reviewed and are negative.  Physical Exam Updated Vital Signs BP (!) 147/65 (BP Location: Left Arm)   Pulse 64   Temp 98.1 F (36.7 C) (Oral)   Resp 16   SpO2 99%   Physical Exam Vitals and nursing note reviewed.  Constitutional:      General: She is not in acute distress.    Appearance: She is well-developed.  HENT:     Head: Normocephalic.     Comments: Hematoma left forehead    Right Ear: External ear normal.     Left Ear: External ear normal.  Eyes:     General: No scleral icterus.       Right eye: No discharge.        Left eye: No discharge.     Conjunctiva/sclera:  Conjunctivae normal.  Neck:     Trachea: No tracheal deviation.  Cardiovascular:     Rate and Rhythm: Normal rate and regular rhythm.  Pulmonary:     Effort: Pulmonary effort is normal. No respiratory distress.     Breath sounds: Normal breath sounds. No stridor. No wheezing or rales.  Abdominal:     General: Bowel sounds are normal. There is no distension.     Palpations: Abdomen is soft.     Tenderness: There is no abdominal tenderness. There is no guarding or rebound.  Musculoskeletal:        General: Tenderness present. No deformity.     Right shoulder: No swelling, tenderness or bony tenderness.     Left shoulder: No swelling, tenderness or bony tenderness.     Right wrist: No swelling, tenderness or bony tenderness.     Left wrist: No swelling, tenderness or bony tenderness.     Cervical back: Neck supple. No swelling, tenderness or bony tenderness.     Thoracic back: No swelling, tenderness or bony tenderness.     Lumbar back: No swelling, tenderness or bony tenderness.     Right hip: No tenderness or bony tenderness. Normal range of motion.     Left hip: No tenderness or bony tenderness. Normal range of motion.     Left knee: Ecchymosis present. Tenderness present.     Right ankle: No swelling. No tenderness.     Left ankle: No swelling. No tenderness.  Skin:    General: Skin is warm and dry.     Findings: No rash.  Neurological:     General: No focal deficit present.     Mental Status: She is alert.     Cranial Nerves: No cranial nerve deficit (no facial droop, extraocular movements intact, no slurred speech).     Sensory: No sensory deficit.     Motor: No abnormal muscle tone or seizure activity.     Coordination: Coordination normal.  Psychiatric:        Mood and Affect: Mood normal.    ED Results / Procedures / Treatments   Labs (all labs ordered are listed, but only abnormal results are displayed) Labs Reviewed - No data to display  EKG None  Radiology CT  HEAD WO CONTRAST (5MM)  Result Date: 10/26/2020 CLINICAL DATA:  Head trauma, minor (Age >= 65y). Trip and fall. Forehead hematoma. EXAM: CT HEAD WITHOUT CONTRAST TECHNIQUE: Contiguous axial images were obtained from the base of the skull through the vertex without intravenous contrast. COMPARISON:  None. FINDINGS: Brain: There is no evidence of an acute  infarct, intracranial hemorrhage, mass, midline shift, or extra-axial fluid collection. Mild cerebral atrophy is within normal limits for age. Vascular: Calcified atherosclerosis at the skull base. No hyperdense vessel. Skull: No fracture or suspicious osseous lesion. Sinuses/Orbits: Visualized paranasal sinuses and mastoid air cells are clear. Other: Moderate-sized left forehead hematoma. IMPRESSION: 1. Left forehead hematoma. 2. No evidence of acute intracranial abnormality. Electronically Signed   By: Logan Bores M.D.   On: 10/26/2020 14:51   DG Knee Complete 4 Views Left  Result Date: 10/26/2020 CLINICAL DATA:  Trip and fall onto left knee with swelling and bruising. EXAM: LEFT KNEE - COMPLETE 4+ VIEW COMPARISON:  02/16/2017 FINDINGS: No acute fracture, dislocation, or knee joint effusion is identified. Tricompartmental osteoarthrosis has progressed including progressive, severe medial compartment joint space narrowing. The soft tissues are unremarkable. IMPRESSION: No acute osseous abnormality identified. Progressive tricompartmental osteoarthrosis. Electronically Signed   By: Logan Bores M.D.   On: 10/26/2020 14:53    Procedures Procedures   Medications Ordered in ED Medications - No data to display  ED Course  I have reviewed the triage vital signs and the nursing notes.  Pertinent labs & imaging results that were available during my care of the patient were reviewed by me and considered in my medical decision making (see chart for details).  Clinical Course as of 10/26/20 1651  Thu Oct 26, 2020  1633 CT HEAD WO CONTRAST (5MM) Findings  reviewed.  No intracranial abnormality [JK]  1633 DG Knee Complete 4 Views Left Osteoarthrosis without fracture [JK]    Clinical Course User Index [JK] Dorie Rank, MD   MDM Rules/Calculators/A&P                           CT scan without signs of serious head injury.  No subarachnoid hemorrhage or subdural hematoma.  No Fracture.  Consistent with forehead contusion.  Knee x-rays without signs of fracture or dislocation.  Findings consistent with soft tissue injury.  Stable for discharge.  Over-the-counter medication as needed.  Ice to help with the swelling Final Clinical Impression(s) / ED Diagnoses Final diagnoses:  Contusion of forehead, initial encounter  Contusion of left knee, initial encounter    Rx / DC Orders ED Discharge Orders     None        Dorie Rank, MD 10/26/20 1651

## 2020-10-26 NOTE — ED Provider Notes (Signed)
Emergency Medicine Provider Triage Evaluation Note  Doris White , a 70 y.o. female  was evaluated in triage.  Pt complains of hitting head and left knee Pt tripped and feel at work  Review of Systems  Positive: Head pain  Negative: No loc  Physical Exam  BP (!) 149/76   Pulse 69   Temp 98.1 F (36.7 C) (Oral)   Resp 18   SpO2 100%  Gen:   Awake, no distress   Resp:  Normal effort  MSK:   Moves extremities without difficulty  Other:  Hematoma left forehead.  Swollen left knee,  abrasion right hand  Medical Decision Making  Medically screening exam initiated at 2:04 PM.  Appropriate orders placed.  Doris White was informed that the remainder of the evaluation will be completed by another provider, this initial triage assessment does not replace that evaluation, and the importance of remaining in the ED until their evaluation is complete.     Doris White 10/26/20 1405    Doris Natal, MD 10/26/20 [phone]

## 2020-11-01 DIAGNOSIS — E039 Hypothyroidism, unspecified: Secondary | ICD-10-CM | POA: Diagnosis not present

## 2020-11-14 DIAGNOSIS — Z03818 Encounter for observation for suspected exposure to other biological agents ruled out: Secondary | ICD-10-CM | POA: Diagnosis not present

## 2020-11-14 DIAGNOSIS — R5383 Other fatigue: Secondary | ICD-10-CM | POA: Diagnosis not present

## 2020-11-14 DIAGNOSIS — R509 Fever, unspecified: Secondary | ICD-10-CM | POA: Diagnosis not present

## 2020-11-16 ENCOUNTER — Emergency Department (HOSPITAL_COMMUNITY)
Admission: EM | Admit: 2020-11-16 | Discharge: 2020-11-16 | Disposition: A | Discharge status: Home / Self Care | Payer: Medicare Other | Attending: Emergency Medicine | Admitting: Emergency Medicine

## 2020-11-16 ENCOUNTER — Encounter (HOSPITAL_COMMUNITY): Payer: Self-pay | Admitting: Emergency Medicine

## 2020-11-16 ENCOUNTER — Other Ambulatory Visit: Payer: Self-pay

## 2020-11-16 DIAGNOSIS — Z5321 Procedure and treatment not carried out due to patient leaving prior to being seen by health care provider: Secondary | ICD-10-CM | POA: Diagnosis not present

## 2020-11-16 DIAGNOSIS — R6889 Other general symptoms and signs: Secondary | ICD-10-CM | POA: Diagnosis not present

## 2020-11-16 DIAGNOSIS — R531 Weakness: Secondary | ICD-10-CM | POA: Diagnosis not present

## 2020-11-16 DIAGNOSIS — R42 Dizziness and giddiness: Secondary | ICD-10-CM | POA: Diagnosis not present

## 2020-11-16 DIAGNOSIS — Z743 Need for continuous supervision: Secondary | ICD-10-CM | POA: Diagnosis not present

## 2020-11-16 DIAGNOSIS — R5383 Other fatigue: Secondary | ICD-10-CM | POA: Diagnosis not present

## 2020-11-16 DIAGNOSIS — S0990XA Unspecified injury of head, initial encounter: Secondary | ICD-10-CM | POA: Diagnosis not present

## 2020-11-16 DIAGNOSIS — R350 Frequency of micturition: Secondary | ICD-10-CM | POA: Diagnosis not present

## 2020-11-16 DIAGNOSIS — R404 Transient alteration of awareness: Secondary | ICD-10-CM | POA: Diagnosis not present

## 2020-11-16 DIAGNOSIS — R0602 Shortness of breath: Secondary | ICD-10-CM | POA: Diagnosis not present

## 2020-11-16 DIAGNOSIS — R001 Bradycardia, unspecified: Secondary | ICD-10-CM | POA: Diagnosis not present

## 2020-11-16 LAB — CBC WITH DIFFERENTIAL/PLATELET
Abs Immature Granulocytes: 0.01 10*3/uL (ref 0.00–0.07)
Basophils Absolute: 0 10*3/uL (ref 0.0–0.1)
Basophils Relative: 1 %
Eosinophils Absolute: 0.1 10*3/uL (ref 0.0–0.5)
Eosinophils Relative: 1 %
HCT: 46.6 % — ABNORMAL HIGH (ref 36.0–46.0)
Hemoglobin: 15 g/dL (ref 12.0–15.0)
Immature Granulocytes: 0 %
Lymphocytes Relative: 31 %
Lymphs Abs: 2 10*3/uL (ref 0.7–4.0)
MCH: 28.7 pg (ref 26.0–34.0)
MCHC: 32.2 g/dL (ref 30.0–36.0)
MCV: 89.3 fL (ref 80.0–100.0)
Monocytes Absolute: 0.5 10*3/uL (ref 0.1–1.0)
Monocytes Relative: 7 %
Neutro Abs: 3.9 10*3/uL (ref 1.7–7.7)
Neutrophils Relative %: 60 %
Platelets: 341 10*3/uL (ref 150–400)
RBC: 5.22 MIL/uL — ABNORMAL HIGH (ref 3.87–5.11)
RDW: 14.1 % (ref 11.5–15.5)
WBC: 6.5 10*3/uL (ref 4.0–10.5)
nRBC: 0 % (ref 0.0–0.2)

## 2020-11-16 LAB — COMPREHENSIVE METABOLIC PANEL
ALT: 14 U/L (ref 0–44)
AST: 21 U/L (ref 15–41)
Albumin: 3.7 g/dL (ref 3.5–5.0)
Alkaline Phosphatase: 68 U/L (ref 38–126)
Anion gap: 10 (ref 5–15)
BUN: 7 mg/dL — ABNORMAL LOW (ref 8–23)
CO2: 25 mmol/L (ref 22–32)
Calcium: 9.3 mg/dL (ref 8.9–10.3)
Chloride: 99 mmol/L (ref 98–111)
Creatinine, Ser: 0.62 mg/dL (ref 0.44–1.00)
GFR, Estimated: >60 mL/min (ref >60)
Glucose, Bld: 91 mg/dL (ref 70–99)
Potassium: 3.9 mmol/L (ref 3.5–5.1)
Sodium: 134 mmol/L — ABNORMAL LOW (ref 135–145)
Total Bilirubin: 0.9 mg/dL (ref 0.3–1.2)
Total Protein: 7.2 g/dL (ref 6.5–8.1)

## 2020-11-16 NOTE — ED Provider Notes (Signed)
Emergency Medicine Provider Triage Evaluation Note  Doris White , a 70 y.o. female  was evaluated in triage.  Pt complains of dizziness.  Patient has history of vertigo, had episode of same on Saturday, took meclizine, symptoms resolved.  Following, patient has felt weaker and "on the verge of another episode of vertigo."  Of note, patient had a fall 3 weeks ago for which she received a head CT with that was normal.   Review of Systems  Positive: Dizziness, weakness Negative: Fevers, chills, chest pain, shortness of breath, nausea, vomiting  Physical Exam  BP 138/62 (BP Location: Right Arm)   Pulse (!) 58   Temp 99.1 F (37.3 C) (Oral)   Resp 16   SpO2 99%  Gen:   Awake, no distress  Resp:  Normal effort MSK:   Moves extremities without difficulty Other:  No neurodeficits, full strength in all extremities, cerebellar testing normal  Medical Decision Making  Medically screening exam initiated at 2:10 PM.  Appropriate orders placed.  ELKY FUNCHES was informed that the remainder of the evaluation will be completed by another provider, this initial triage assessment does not replace that evaluation, and the importance of remaining in the ED until their evaluation is complete.    Nestor Lewandowsky 11/16/20 1413    Dorie Rank, MD 11/19/20 [phone]

## 2020-11-16 NOTE — ED Notes (Signed)
PT eloped

## 2020-11-16 NOTE — ED Triage Notes (Signed)
Patient BIB [facility] from Dr office, complaint of dizziness since Saturday, pt with hx of vertigo, took meclizine Saturday and symptoms went away.

## 2020-11-23 DIAGNOSIS — R42 Dizziness and giddiness: Secondary | ICD-10-CM | POA: Diagnosis not present

## 2020-11-23 DIAGNOSIS — R0602 Shortness of breath: Secondary | ICD-10-CM | POA: Diagnosis not present

## 2020-11-27 ENCOUNTER — Other Ambulatory Visit: Payer: Self-pay | Admitting: Family Medicine

## 2020-11-27 DIAGNOSIS — R42 Dizziness and giddiness: Secondary | ICD-10-CM

## 2021-01-01 DIAGNOSIS — Z23 Encounter for immunization: Secondary | ICD-10-CM | POA: Diagnosis not present

## 2021-01-01 DIAGNOSIS — R42 Dizziness and giddiness: Secondary | ICD-10-CM | POA: Diagnosis not present

## 2021-01-01 DIAGNOSIS — R0602 Shortness of breath: Secondary | ICD-10-CM | POA: Diagnosis not present

## 2021-01-02 ENCOUNTER — Other Ambulatory Visit: Payer: Self-pay | Admitting: Family Medicine

## 2021-01-02 DIAGNOSIS — R42 Dizziness and giddiness: Secondary | ICD-10-CM

## 2021-01-10 DIAGNOSIS — Z03818 Encounter for observation for suspected exposure to other biological agents ruled out: Secondary | ICD-10-CM | POA: Diagnosis not present

## 2021-01-10 DIAGNOSIS — R110 Nausea: Secondary | ICD-10-CM | POA: Diagnosis not present

## 2021-01-12 DIAGNOSIS — R5383 Other fatigue: Secondary | ICD-10-CM | POA: Diagnosis not present

## 2021-01-12 DIAGNOSIS — R112 Nausea with vomiting, unspecified: Secondary | ICD-10-CM | POA: Diagnosis not present

## 2021-02-02 ENCOUNTER — Encounter: Payer: Self-pay | Admitting: Cardiovascular Disease

## 2021-02-02 ENCOUNTER — Ambulatory Visit (INDEPENDENT_AMBULATORY_CARE_PROVIDER_SITE_OTHER): Payer: Medicare Other | Admitting: Cardiovascular Disease

## 2021-02-02 DIAGNOSIS — R011 Cardiac murmur, unspecified: Secondary | ICD-10-CM

## 2021-02-02 DIAGNOSIS — R42 Dizziness and giddiness: Secondary | ICD-10-CM

## 2021-02-02 DIAGNOSIS — R0602 Shortness of breath: Secondary | ICD-10-CM

## 2021-02-02 NOTE — Progress Notes (Signed)
Patient did not show for appointment.   

## 2021-02-17 ENCOUNTER — Other Ambulatory Visit: Payer: Self-pay

## 2021-02-17 ENCOUNTER — Emergency Department (HOSPITAL_COMMUNITY)
Admission: EM | Admit: 2021-02-17 | Discharge: 2021-02-17 | Disposition: A | Discharge status: Home / Self Care | Payer: Medicare Other | Attending: Emergency Medicine | Admitting: Emergency Medicine

## 2021-02-17 ENCOUNTER — Encounter (HOSPITAL_COMMUNITY): Payer: Self-pay | Admitting: Emergency Medicine

## 2021-02-17 ENCOUNTER — Emergency Department (HOSPITAL_COMMUNITY): Payer: Medicare Other

## 2021-02-17 DIAGNOSIS — M79622 Pain in left upper arm: Secondary | ICD-10-CM | POA: Diagnosis not present

## 2021-02-17 DIAGNOSIS — I10 Essential (primary) hypertension: Secondary | ICD-10-CM | POA: Insufficient documentation

## 2021-02-17 DIAGNOSIS — R531 Weakness: Secondary | ICD-10-CM | POA: Insufficient documentation

## 2021-02-17 DIAGNOSIS — Z79899 Other long term (current) drug therapy: Secondary | ICD-10-CM | POA: Insufficient documentation

## 2021-02-17 DIAGNOSIS — Z20822 Contact with and (suspected) exposure to covid-19: Secondary | ICD-10-CM | POA: Insufficient documentation

## 2021-02-17 DIAGNOSIS — R5383 Other fatigue: Secondary | ICD-10-CM | POA: Diagnosis not present

## 2021-02-17 DIAGNOSIS — M79602 Pain in left arm: Secondary | ICD-10-CM | POA: Diagnosis not present

## 2021-02-17 DIAGNOSIS — I517 Cardiomegaly: Secondary | ICD-10-CM | POA: Diagnosis not present

## 2021-02-17 DIAGNOSIS — R0602 Shortness of breath: Secondary | ICD-10-CM | POA: Diagnosis not present

## 2021-02-17 DIAGNOSIS — R9431 Abnormal electrocardiogram [ECG] [EKG]: Secondary | ICD-10-CM | POA: Diagnosis not present

## 2021-02-17 LAB — CBC WITH DIFFERENTIAL/PLATELET
Abs Immature Granulocytes: 0.01 10*3/uL (ref 0.00–0.07)
Basophils Absolute: 0 10*3/uL (ref 0.0–0.1)
Basophils Relative: 1 %
Eosinophils Absolute: 0.1 10*3/uL (ref 0.0–0.5)
Eosinophils Relative: 1 %
HCT: 48.5 % — ABNORMAL HIGH (ref 36.0–46.0)
Hemoglobin: 15.9 g/dL — ABNORMAL HIGH (ref 12.0–15.0)
Immature Granulocytes: 0 %
Lymphocytes Relative: 32 %
Lymphs Abs: 1.9 10*3/uL (ref 0.7–4.0)
MCH: 29.4 pg (ref 26.0–34.0)
MCHC: 32.8 g/dL (ref 30.0–36.0)
MCV: 89.8 fL (ref 80.0–100.0)
Monocytes Absolute: 0.4 10*3/uL (ref 0.1–1.0)
Monocytes Relative: 8 %
Neutro Abs: 3.4 10*3/uL (ref 1.7–7.7)
Neutrophils Relative %: 58 %
Platelets: 350 10*3/uL (ref 150–400)
RBC: 5.4 MIL/uL — ABNORMAL HIGH (ref 3.87–5.11)
RDW: 13.7 % (ref 11.5–15.5)
WBC: 5.9 10*3/uL (ref 4.0–10.5)
nRBC: 0 % (ref 0.0–0.2)

## 2021-02-17 LAB — COMPREHENSIVE METABOLIC PANEL
ALT: 12 U/L (ref 0–44)
AST: 19 U/L (ref 15–41)
Albumin: 3.8 g/dL (ref 3.5–5.0)
Alkaline Phosphatase: 68 U/L (ref 38–126)
Anion gap: 9 (ref 5–15)
BUN: 8 mg/dL (ref 8–23)
CO2: 27 mmol/L (ref 22–32)
Calcium: 9.6 mg/dL (ref 8.9–10.3)
Chloride: 100 mmol/L (ref 98–111)
Creatinine, Ser: 0.69 mg/dL (ref 0.44–1.00)
GFR, Estimated: >60 mL/min (ref >60)
Glucose, Bld: 126 mg/dL — ABNORMAL HIGH (ref 70–99)
Potassium: 3.8 mmol/L (ref 3.5–5.1)
Sodium: 136 mmol/L (ref 135–145)
Total Bilirubin: 0.6 mg/dL (ref 0.3–1.2)
Total Protein: 7.2 g/dL (ref 6.5–8.1)

## 2021-02-17 LAB — RESP PANEL BY RT-PCR (FLU A&B, COVID) ARPGX2
Influenza A by PCR: NEGATIVE
Influenza B by PCR: NEGATIVE
SARS Coronavirus 2 by RT PCR: NEGATIVE

## 2021-02-17 LAB — TROPONIN I (HIGH SENSITIVITY): Troponin I (High Sensitivity): 3 ng/L (ref <18)

## 2021-02-17 NOTE — ED Triage Notes (Signed)
Pt reports feeling cold, fatigue, SOB, and generalized weakness since Wednesday.  Reports "twinges" in L upper arm x 1 week.  Denies pain.

## 2021-02-17 NOTE — Discharge Instructions (Addendum)
Follow-up with your primary care doctor and the cardiologist as planned.  The test today in the ER was reassuring.  Return as needed for worsening symptoms.

## 2021-02-17 NOTE — ED Provider Notes (Signed)
[facility] EMERGENCY DEPARTMENT Provider Note   CSN: [id number] Arrival date & time: 02/17/21  1319     History Chief Complaint  Patient presents with   Weakness    Doris White is a 70 y.o. female.   Weakness  Patient presents to the ED for evaluation of generalized weakness and fatigue.  Patient states her symptoms started a couple days ago.  She just felt overall very fatigued as if her entire body was heavy.  She feels like her symptoms have progressed over the last couple days.  She then started to have intermittent twinges in her left arm.  Its not really painful but she became concerned about possible cardiac causes.  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  She started become more anxious.  Patient denies any significant cough although has had some slight intermittent cough.  She denies any congestion.  No vomiting or diarrhea.  She denies any new leg swelling.  Past Medical History:  Diagnosis Date   Arthritis    Hypertension Dx 2013   Pancreatitis 2013    Patient Active Problem List   Diagnosis Date Noted   Morbid obesity with BMI of 40.0-44.9, adult ([place]) 07/04/2014   HTN (hypertension) 07/04/2014   Low back pain with left-sided sciatica 07/04/2014    History reviewed. No pertinent surgical history.   OB History   No obstetric history on file.     Family History  Problem Relation Age of Onset   Diabetes Mother    Heart failure Father    COPD Father     Social History   Tobacco Use   Smoking status: Never   Smokeless tobacco: Never  Substance Use Topics   Alcohol use: Yes   Drug use: No    Home Medications Prior to Admission medications   Medication Sig Start Date End Date Taking? Authorizing Provider  amLODipine (NORVASC) 5 MG tablet Take 1 tablet (5 mg total) by mouth daily. 03/24/14   Domenic Moras, PA-C  diclofenac sodium (VOLTAREN) 1 % GEL Apply 1 application  topically 2 (two) times daily as needed. 01/02/17   [provider]  docusate sodium (COLACE) 100 MG capsule Take 100 mg by mouth daily.    [provider]  hydrochlorothiazide (HYDRODIURIL) 25 MG tablet Take 1 tablet (25 mg total) by mouth daily. 03/24/14   Domenic Moras, PA-C  ibuprofen (ADVIL,MOTRIN) 600 MG tablet Take 1 tablet (600 mg total) by mouth every 8 (eight) hours as needed for moderate pain. Patient not taking: Reported on 02/11/2017 07/04/14   Boykin Nearing, MD  meclizine (ANTIVERT) 25 MG tablet Take 1 tablet (25 mg total) by mouth 3 (three) times daily as needed for dizziness. 03/10/17   Langston Masker B, PA-C  Menthol, Topical Analgesic, (BIOFREEZE) 4 % GEL Apply 1 application topically daily as needed (pain).    [provider]  methocarbamol (ROBAXIN) 500 MG tablet Take 1 tablet (500 mg total) by mouth every 8 (eight) hours as needed for muscle spasms. Patient not taking: Reported on 03/10/2017 02/16/17   Drenda Freeze, MD  ondansetron (ZOFRAN) 4 MG tablet Take 1 tablet (4 mg total) by mouth every 6 (six) hours. 03/10/17   Langston Masker B, PA-C  oxyCODONE-acetaminophen (PERCOCET) 5-325 MG tablet Take 1 tablet by mouth every 6 (six) hours as needed. Patient not taking: Reported on 03/10/2017 02/16/17   Drenda Freeze, MD    Allergies  Erythromycin, Lisinopril, and Other  Review of Systems   Review of Systems  Neurological:  Positive for weakness.  All other systems reviewed and are negative.  Physical Exam Updated Vital Signs BP (!) 150/70 (BP Location: Right Arm)    Pulse 70    Temp 98.9 F (37.2 C) (Oral)    Resp 16    SpO2 99%   Physical Exam Vitals and nursing note reviewed.  Constitutional:      General: She is not in acute distress.    Appearance: She is well-developed.  HENT:     Head: Normocephalic and atraumatic.     Right Ear: External ear normal.     Left Ear: External ear normal.  Eyes:     General: No scleral icterus.        Right eye: No discharge.        Left eye: No discharge.     Conjunctiva/sclera: Conjunctivae normal.  Neck:     Trachea: No tracheal deviation.  Cardiovascular:     Rate and Rhythm: Normal rate and regular rhythm.  Pulmonary:     Effort: Pulmonary effort is normal. No respiratory distress.     Breath sounds: Normal breath sounds. No stridor. No wheezing or rales.  Abdominal:     General: Bowel sounds are normal. There is no distension.     Palpations: Abdomen is soft.     Tenderness: There is no abdominal tenderness. There is no guarding or rebound.  Musculoskeletal:        General: No tenderness or deformity.     Cervical back: Neck supple.  Skin:    General: Skin is warm and dry.     Findings: No rash.  Neurological:     General: No focal deficit present.     Mental Status: She is alert.     Cranial Nerves: No cranial nerve deficit (no facial droop, extraocular movements intact, no slurred speech).     Sensory: No sensory deficit.     Motor: No weakness, abnormal muscle tone or seizure activity.     Coordination: Coordination normal.     Comments: Grip strength bilaterally, normal plantar flexion bilaterally, normal sensation  Psychiatric:        Mood and Affect: Mood normal.    ED Results / Procedures / Treatments   Labs (all labs ordered are listed, but only abnormal results are displayed) Labs Reviewed  CBC WITH DIFFERENTIAL/PLATELET - Abnormal; Notable for the following components:      Result Value   RBC 5.40 (*)    Hemoglobin 15.9 (*)    HCT 48.5 (*)    All other components within normal limits  COMPREHENSIVE METABOLIC PANEL - Abnormal; Notable for the following components:   Glucose, Bld 126 (*)    All other components within normal limits  RESP PANEL BY RT-PCR (FLU A&B, COVID) [id number]  TROPONIN I (HIGH SENSITIVITY)    EKG EKG Interpretation  Date/Time:  Saturday February 17 2021 13:35:53 EST Ventricular Rate:  69 PR Interval:  160 QRS  Duration: 88 QT Interval:  392 QTC Calculation: 420 R Axis:   51 Text Interpretation: Normal sinus rhythm Normal ECG Confirmed by Dorie Rank [phone]) on 02/17/2021 2:21:02 PM  Radiology DG Chest 2 View  Result Date: 02/17/2021 CLINICAL DATA:  Shortness of breath EXAM: CHEST - 2 VIEW COMPARISON:  09/17/2019 FINDINGS: Stable cardiomegaly and chronic appearing nonspecific interstitial prominence. No superimposed acute edema, definite pneumonia, collapse or consolidation. No effusion or pneumothorax. Trachea midline.  Degenerative changes of the spine. IMPRESSION: Stable exam.  No active chest disease. Electronically Signed   By: Jerilynn Mages.  Shick M.D.   On: 02/17/2021 13:58    Procedures Procedures   Medications Ordered in ED Medications - No data to display  ED Course  I have reviewed the triage vital signs and the nursing notes.  Pertinent labs & imaging results that were available during my care of the patient were reviewed by me and considered in my medical decision making (see chart for details).  Clinical Course as of 02/18/21 1004  Sat Feb 17, 2021  1420 Chest x-ray without acute findings [JK]  1791 CBC and metabolic panel normal. [JK]  1526 Covid and flu are negative. [JK]  1526 Troponin normal. [JK]    Clinical Course User Index [JK] Dorie Rank, MD   MDM Rules/Calculators/A&P                         Patient presented to the ED with complaints weakness fatigue and some twinges of discomfort in her arm.  Patient does not have any focal deficits noted on exam.  Doubt stroke or TIA.  Physical exam is overall reassuring.  Patient has been able to walk around the ED without difficulty.  No signs of acute anemia or electrolyte abnormalities.  Patient is negative for COVID and flu.  Chest x-ray does not show pneumonia.  She is not having any difficulty with her breathing.  No tachycardia.  I doubt pulmonary embolism.  EKG is reassuring and her troponin is negative.  I doubt acute coronary  syndrome.  This time appears appropriate for discharge and outpatient follow-up for further evaluation.    Final Clinical Impression(s) / ED Diagnoses Final diagnoses:  Pain of left upper extremity  Weakness    Rx / DC Orders ED Discharge Orders     None        Dorie Rank, MD 02/18/21 1006

## 2021-02-17 NOTE — ED Notes (Signed)
Pt verbalized understanding of d/c instructions, meds and followup care. Denies questions. VSS, no distress noted. Steady gait to exit with all belongings.  ?

## 2021-02-17 NOTE — ED Provider Notes (Signed)
Emergency Medicine Provider Triage Evaluation Note  Doris White , a 70 y.o. female  was evaluated in triage.  Pt complains of generalized weakness. She states that same began on Thursday when she began feeling 'like something wasn't right.' She states she has felt progressively weak since and 'like my legs are wobbly.' Endorses some subjective fevers. When asked if she was short of breath she states 'maybe.' Denies any cough, congestion, rhinorrhea, n/v/d. Does endorse swelling in her legs but states that is chronic from her amlodopine. No leg pain.  Review of Systems  Positive: Generalized weakness and fatigue Negative:   Physical Exam  BP (!) 153/67 (BP Location: Right Arm)    Pulse 75    Temp 99.5 F (37.5 C) (Oral)    Resp 16    SpO2 100%  Gen:   Awake, no distress   Resp:  Normal effort  MSK:   Moves extremities without difficulty  Other:  Neurologically intact and ambulatory without assistance with normal gait  Medical Decision Making  Medically screening exam initiated at 1:35 PM.  Appropriate orders placed.  JAHNAY LANTIER was informed that the remainder of the evaluation will be completed by another provider, this initial triage assessment does not replace that evaluation, and the importance of remaining in the ED until their evaluation is complete.     Nestor Lewandowsky 02/17/21 1340    Dorie Rank, MD 02/18/21 [phone]

## 2021-02-27 ENCOUNTER — Ambulatory Visit: Payer: Medicare Other | Admitting: Internal Medicine

## 2021-05-11 DIAGNOSIS — R051 Acute cough: Secondary | ICD-10-CM | POA: Diagnosis not present

## 2021-06-21 DIAGNOSIS — D492 Neoplasm of unspecified behavior of bone, soft tissue, and skin: Secondary | ICD-10-CM | POA: Diagnosis not present

## 2021-06-21 DIAGNOSIS — G43B0 Ophthalmoplegic migraine, not intractable: Secondary | ICD-10-CM | POA: Diagnosis not present

## 2021-07-02 ENCOUNTER — Encounter (HOSPITAL_COMMUNITY): Payer: Self-pay | Admitting: Emergency Medicine

## 2021-07-02 ENCOUNTER — Emergency Department (HOSPITAL_COMMUNITY): Payer: Medicare Other

## 2021-07-02 ENCOUNTER — Emergency Department (HOSPITAL_COMMUNITY)
Admission: EM | Admit: 2021-07-02 | Discharge: 2021-07-02 | Disposition: A | Discharge status: Home / Self Care | Payer: Medicare Other | Attending: Emergency Medicine | Admitting: Emergency Medicine

## 2021-07-02 DIAGNOSIS — Z79899 Other long term (current) drug therapy: Secondary | ICD-10-CM | POA: Diagnosis not present

## 2021-07-02 DIAGNOSIS — R079 Chest pain, unspecified: Secondary | ICD-10-CM | POA: Diagnosis not present

## 2021-07-02 DIAGNOSIS — I10 Essential (primary) hypertension: Secondary | ICD-10-CM | POA: Insufficient documentation

## 2021-07-02 DIAGNOSIS — R1013 Epigastric pain: Secondary | ICD-10-CM | POA: Diagnosis not present

## 2021-07-02 DIAGNOSIS — R0602 Shortness of breath: Secondary | ICD-10-CM | POA: Diagnosis not present

## 2021-07-02 DIAGNOSIS — R0789 Other chest pain: Secondary | ICD-10-CM | POA: Diagnosis not present

## 2021-07-02 LAB — HEPATIC FUNCTION PANEL
ALT: 11 U/L (ref 0–44)
AST: 19 U/L (ref 15–41)
Albumin: 3.5 g/dL (ref 3.5–5.0)
Alkaline Phosphatase: 56 U/L (ref 38–126)
Bilirubin, Direct: <0.1 mg/dL (ref 0.0–0.2)
Total Bilirubin: 0.6 mg/dL (ref 0.3–1.2)
Total Protein: 6.8 g/dL (ref 6.5–8.1)

## 2021-07-02 LAB — BASIC METABOLIC PANEL
Anion gap: 10 (ref 5–15)
BUN: 12 mg/dL (ref 8–23)
CO2: 27 mmol/L (ref 22–32)
Calcium: 9.1 mg/dL (ref 8.9–10.3)
Chloride: 99 mmol/L (ref 98–111)
Creatinine, Ser: 0.78 mg/dL (ref 0.44–1.00)
GFR, Estimated: >60 mL/min (ref >60)
Glucose, Bld: 170 mg/dL — ABNORMAL HIGH (ref 70–99)
Potassium: 3.3 mmol/L — ABNORMAL LOW (ref 3.5–5.1)
Sodium: 136 mmol/L (ref 135–145)

## 2021-07-02 LAB — CBC
HCT: 45.2 % (ref 36.0–46.0)
Hemoglobin: 14.6 g/dL (ref 12.0–15.0)
MCH: 29.3 pg (ref 26.0–34.0)
MCHC: 32.3 g/dL (ref 30.0–36.0)
MCV: 90.6 fL (ref 80.0–100.0)
Platelets: 289 10*3/uL (ref 150–400)
RBC: 4.99 MIL/uL (ref 3.87–5.11)
RDW: 14 % (ref 11.5–15.5)
WBC: 5.5 10*3/uL (ref 4.0–10.5)
nRBC: 0 % (ref 0.0–0.2)

## 2021-07-02 LAB — TROPONIN I (HIGH SENSITIVITY)
Troponin I (High Sensitivity): 4 ng/L (ref <18)
Troponin I (High Sensitivity): 4 ng/L (ref <18)

## 2021-07-02 LAB — LIPASE, BLOOD: Lipase: 28 U/L (ref 11–51)

## 2021-07-02 MED ORDER — SUCRALFATE 1 G PO TABS: 1.0000 g | ORAL_TABLET | Freq: Three times a day (TID) | ORAL | 0 refills | Status: AC

## 2021-07-02 MED ORDER — LIDOCAINE VISCOUS HCL 2 % MT SOLN
15.0000 mL | Freq: Once | OROMUCOSAL | Status: AC
  Administered 2021-07-02: 15 mL via ORAL
  Filled 2021-07-02: qty 15

## 2021-07-02 MED ORDER — FAMOTIDINE IN NACL 20-0.9 MG/50ML-% IV SOLN
20.0000 mg | Freq: Once | INTRAVENOUS | Status: AC
  Administered 2021-07-02: 20 mg via INTRAVENOUS
  Filled 2021-07-02: qty 50

## 2021-07-02 MED ORDER — SODIUM CHLORIDE 0.9 % IV BOLUS
1000.0000 mL | Freq: Once | INTRAVENOUS | Status: AC
  Administered 2021-07-02: 1000 mL via INTRAVENOUS

## 2021-07-02 MED ORDER — MAGNESIUM OXIDE -MG SUPPLEMENT 400 (240 MG) MG PO TABS
400.0000 mg | ORAL_TABLET | Freq: Once | ORAL | Status: AC
  Administered 2021-07-02: 400 mg via ORAL
  Filled 2021-07-02: qty 1

## 2021-07-02 MED ORDER — ALUM & MAG HYDROXIDE-SIMETH 200-200-20 MG/5ML PO SUSP
30.0000 mL | Freq: Once | ORAL | Status: AC
  Administered 2021-07-02: 30 mL via ORAL
  Filled 2021-07-02: qty 30

## 2021-07-02 MED ORDER — POTASSIUM CHLORIDE CRYS ER 20 MEQ PO TBCR
40.0000 meq | EXTENDED_RELEASE_TABLET | Freq: Once | ORAL | Status: AC
  Administered 2021-07-02: 40 meq via ORAL
  Filled 2021-07-02: qty 2

## 2021-07-02 NOTE — ED Provider Notes (Signed)
?[facility] ?Provider Note ? ? ?CSN: [id number] ?Arrival date & time: 07/02/21  0936 ? ?  ? ?History ? ?Chief Complaint  ?Patient presents with  ? Chest Pain  ? Weakness  ? ? ?Doris White is a 71 y.o. female. ? ? Patient as above with significant medical history as below, including arthritis, hypertension, pancreatitis who presents to the ED with complaint of epigastric discomfort.  Patient describes the sensation as a "clenching" sensation.  Does not radiate.  Occurs intermittently.  Worse at nighttime when lying down flat.  Not associated with dyspnea, diaphoresis, nausea or vomiting.  No recent diet or medication changes.  Symptoms have been intermittent over the past 3 days.  Mildly worsening.  She does not feel as though she had the symptoms in the past.  No fevers or chills.  Change in bowel or bladder function.  No melena.  No BRBPR.  No recent alcohol use ? ? ? ? ?Past Medical History: ?No date: Arthritis ?Dx 2013: Hypertension ?2013: Pancreatitis ? ?History reviewed. No pertinent surgical history.  ? ? ?The history is provided by the patient. No language interpreter was used.  ?Chest Pain ?Associated symptoms: abdominal pain   ?Associated symptoms: no cough, no dysphagia, no fever, no headache, no nausea, no palpitations, no shortness of breath and no vomiting   ?Weakness ?Associated symptoms: abdominal pain and chest pain   ?Associated symptoms: no cough, no fever, no headaches, no nausea, no shortness of breath and no vomiting   ? ?  ? ?Home Medications ?Prior to Admission medications   ?Medication Sig Start Date End Date Taking? Authorizing Provider  ?sucralfate (CARAFATE) 1 g tablet Take 1 tablet (1 g total) by mouth 4 (four) times daily -  with meals and at bedtime for 7 days. 07/02/21 07/09/21 Yes Wynona Dove A, DO  ?amLODipine (NORVASC) 5 MG tablet Take 1 tablet (5 mg total) by mouth daily. 03/24/14   Domenic Moras, PA-C  ?diclofenac sodium (VOLTAREN) 1 % GEL Apply 1  application topically 2 (two) times daily as needed. 01/02/17   [provider]  ?docusate sodium (COLACE) 100 MG capsule Take 100 mg by mouth daily.    [provider]  ?hydrochlorothiazide (HYDRODIURIL) 25 MG tablet Take 1 tablet (25 mg total) by mouth daily. 03/24/14   Domenic Moras, PA-C  ?ibuprofen (ADVIL,MOTRIN) 600 MG tablet Take 1 tablet (600 mg total) by mouth every 8 (eight) hours as needed for moderate pain. ?Patient not taking: Reported on 02/11/2017 07/04/14   Boykin Nearing, MD  ?meclizine (ANTIVERT) 25 MG tablet Take 1 tablet (25 mg total) by mouth 3 (three) times daily as needed for dizziness. 03/10/17   Albesa Seen, PA-C  ?Menthol, Topical Analgesic, (BIOFREEZE) 4 % GEL Apply 1 application topically daily as needed (pain).    [provider]  ?methocarbamol (ROBAXIN) 500 MG tablet Take 1 tablet (500 mg total) by mouth every 8 (eight) hours as needed for muscle spasms. ?Patient not taking: Reported on 03/10/2017 02/16/17   Drenda Freeze, MD  ?ondansetron (ZOFRAN) 4 MG tablet Take 1 tablet (4 mg total) by mouth every 6 (six) hours. 03/10/17   Langston Masker B, PA-C  ?oxyCODONE-acetaminophen (PERCOCET) 5-325 MG tablet Take 1 tablet by mouth every 6 (six) hours as needed. ?Patient not taking: Reported on 03/10/2017 02/16/17   Drenda Freeze, MD  ?   ? ?Allergies    ?Erythromycin, Lisinopril, and Other   ? ?Review of Systems   ?  Review of Systems  ?Constitutional:  Negative for chills and fever.  ?HENT:  Negative for facial swelling and trouble swallowing.   ?Eyes:  Negative for photophobia and visual disturbance.  ?Respiratory:  Negative for cough and shortness of breath.   ?Cardiovascular:  Positive for chest pain. Negative for palpitations.  ?Gastrointestinal:  Positive for abdominal pain. Negative for nausea and vomiting.  ?Endocrine: Negative for polydipsia and polyuria.  ?Genitourinary:  Negative for difficulty urinating and hematuria.  ?Musculoskeletal:  Negative  for gait problem and joint swelling.  ?Skin:  Negative for pallor and rash.  ?Neurological:  Negative for syncope and headaches.  ?Psychiatric/Behavioral:  Negative for agitation and confusion.   ? ?Physical Exam ?Updated Vital Signs ?BP (!) 143/74   Pulse 72   Temp 98.4 ?F (36.9 ?C) (Oral)   Resp 18   Ht '5\' 3"'$  (1.6 m)   Wt 113 kg   SpO2 99%   BMI 44.13 kg/m?  ?Physical Exam ?Vitals and nursing note reviewed.  ?Constitutional:   ?   General: She is not in acute distress. ?   Appearance: Normal appearance. She is well-developed. She is not ill-appearing or diaphoretic.  ?HENT:  ?   Head: Normocephalic and atraumatic. No raccoon eyes, Battle's sign, right periorbital erythema or left periorbital erythema.  ?   Jaw: There is normal jaw occlusion. No trismus.  ?   Right Ear: External ear normal.  ?   Left Ear: External ear normal.  ?   Nose: Nose normal.  ?   Mouth/Throat:  ?   Mouth: Mucous membranes are moist.  ?Eyes:  ?   General: No scleral icterus.    ?   Right eye: No discharge.     ?   Left eye: No discharge.  ?Cardiovascular:  ?   Rate and Rhythm: Normal rate and regular rhythm.  ?   Pulses: Normal pulses.  ?   Heart sounds: Normal heart sounds.  ?Pulmonary:  ?   Effort: Pulmonary effort is normal. No tachypnea, accessory muscle usage or respiratory distress.  ?   Breath sounds: Normal breath sounds.  ?Abdominal:  ?   General: Abdomen is flat.  ?   Palpations: Abdomen is soft.  ?   Tenderness: There is no abdominal tenderness. There is no guarding or rebound.  ?Musculoskeletal:     ?   General: Normal range of motion.  ?   Cervical back: Full passive range of motion without pain and normal range of motion.  ?   Right lower leg: No edema.  ?   Left lower leg: No edema.  ?Skin: ?   General: Skin is warm and dry.  ?   Capillary Refill: Capillary refill takes less than 2 seconds.  ?Neurological:  ?   Mental Status: She is alert.  ?Psychiatric:     ?   Mood and Affect: Mood normal.     ?   Behavior: Behavior  normal.  ? ? ?ED Results / Procedures / Treatments   ?Labs ?(all labs ordered are listed, but only abnormal results are displayed) ?Labs Reviewed  ?BASIC METABOLIC PANEL - Abnormal; Notable for the following components:  ?    Result Value  ? Potassium 3.3 (*)   ? Glucose, Bld 170 (*)   ? All other components within normal limits  ?CBC  ?HEPATIC FUNCTION PANEL  ?LIPASE, BLOOD  ?TROPONIN I (HIGH SENSITIVITY)  ?TROPONIN I (HIGH SENSITIVITY)  ? ? ?EKG ?EKG Interpretation ? ?Date/Time:  Monday Jul 02 2021 09:42:11 EDT ?Ventricular Rate:  74 ?PR Interval:  166 ?QRS Duration: 82 ?QT Interval:  374 ?QTC Calculation: 415 ?R Axis:   68 ?Text Interpretation: Normal sinus rhythm Low voltage QRS Cannot rule out Anterior infarct , age undetermined T wave abnormality, consider inferior ischemia Abnormal ECG When compared with ECG of 17-Feb-2021 13:35, PREVIOUS ECG IS PRESENT no stemi similar to prior Confirmed by Wynona Dove ([id number]) on 07/02/2021 12:45:19 PM ? ?Radiology ?DG Chest 2 View ? ?Result Date: 07/02/2021 ?CLINICAL DATA:  cp, sob EXAM: CHEST - 2 VIEW COMPARISON:  Chest x-ray 02/17/2021. FINDINGS: Similar enlargement of the cardiac silhouette. Similar mild chronic appearing interstitial prominence. No consolidation. No visible pleural effusions or pneumothorax. Similar degenerative changes. IMPRESSION: Stable exam.  No evidence of acute cardiopulmonary disease. Electronically Signed   By: Margaretha Sheffield M.D.   On: 07/02/2021 10:24   ? ?Procedures ?Procedures  ? ? ?Medications Ordered in ED ?Medications  ?alum & mag hydroxide-simeth (MAALOX/MYLANTA) 200-200-20 MG/5ML suspension 30 mL (30 mLs Oral Given 07/02/21 1054)  ?  And  ?lidocaine (XYLOCAINE) 2 % viscous mouth solution 15 mL (15 mLs Oral Given 07/02/21 1054)  ?famotidine (PEPCID) IVPB 20 mg premix (0 mg Intravenous Stopped 07/02/21 1234)  ?sodium chloride 0.9 % bolus 1,000 mL (0 mLs Intravenous Stopped 07/02/21 1325)  ?potassium chloride SA (KLOR-CON M) CR tablet 40 mEq (40 mEq  Oral Given 07/02/21 1322)  ?magnesium oxide (MAG-OX) tablet 400 mg (400 mg Oral Given 07/02/21 1322)  ? ? ?ED Course/ Medical Decision Making/ A&P ?  ?                        ?Medical Decision Making ?Amount and/

## 2021-07-02 NOTE — ED Notes (Signed)
Pt verbalizes understanding of discharge instructions. Opportunity for questions and answers were provided. Pt discharged from the ED.   ?

## 2021-07-02 NOTE — Discharge Instructions (Addendum)
It was a pleasure caring for you today in the emergency department. ° °Please return to the emergency department for any worsening or worrisome symptoms. ° ° °

## 2021-07-02 NOTE — ED Triage Notes (Signed)
Pt endorses chest pressure all weekend. States the pain is intermittent and central chest. Denies n/v. Endorses some SOB when the clinching pain begins.  ?

## 2021-08-01 DIAGNOSIS — H6983 Other specified disorders of Eustachian tube, bilateral: Secondary | ICD-10-CM | POA: Diagnosis not present

## 2021-08-01 DIAGNOSIS — H6121 Impacted cerumen, right ear: Secondary | ICD-10-CM | POA: Diagnosis not present

## 2021-08-01 DIAGNOSIS — K30 Functional dyspepsia: Secondary | ICD-10-CM | POA: Diagnosis not present

## 2021-09-10 ENCOUNTER — Other Ambulatory Visit: Payer: Self-pay | Admitting: Family Medicine

## 2021-09-10 DIAGNOSIS — R42 Dizziness and giddiness: Secondary | ICD-10-CM

## 2021-09-10 DIAGNOSIS — E785 Hyperlipidemia, unspecified: Secondary | ICD-10-CM | POA: Diagnosis not present

## 2021-09-10 DIAGNOSIS — I10 Essential (primary) hypertension: Secondary | ICD-10-CM | POA: Diagnosis not present

## 2021-09-10 DIAGNOSIS — E039 Hypothyroidism, unspecified: Secondary | ICD-10-CM | POA: Diagnosis not present

## 2021-09-14 ENCOUNTER — Other Ambulatory Visit: Payer: Medicare Other

## 2021-09-14 DIAGNOSIS — R42 Dizziness and giddiness: Secondary | ICD-10-CM | POA: Diagnosis not present

## 2021-09-14 DIAGNOSIS — I6523 Occlusion and stenosis of bilateral carotid arteries: Secondary | ICD-10-CM | POA: Diagnosis not present

## 2021-09-24 ENCOUNTER — Other Ambulatory Visit (HOSPITAL_COMMUNITY): Payer: Self-pay | Admitting: Family Medicine

## 2021-09-24 DIAGNOSIS — R42 Dizziness and giddiness: Secondary | ICD-10-CM

## 2021-09-25 ENCOUNTER — Encounter: Payer: Self-pay | Admitting: Neurology

## 2021-09-25 ENCOUNTER — Ambulatory Visit: Payer: Medicare Other | Admitting: Neurology

## 2021-09-25 VITALS — BP 120/73 | HR 80 | Ht 63.0 in | Wt 219.0 lb

## 2021-09-25 DIAGNOSIS — H6121 Impacted cerumen, right ear: Secondary | ICD-10-CM

## 2021-09-25 DIAGNOSIS — F419 Anxiety disorder, unspecified: Secondary | ICD-10-CM

## 2021-09-25 DIAGNOSIS — R42 Dizziness and giddiness: Secondary | ICD-10-CM
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
## 2021-09-25 NOTE — Patient Instructions (Addendum)
Follow up with Primary care or audiology for right ear wax removal  Follow up with Primary care for management of phobia or referral to psychiatry  Proceed with MRI Brain ordered by your primary care  Continue with Meclizine as needed  Return if worse or any other concerns

## 2021-10-23 ENCOUNTER — Ambulatory Visit: Payer: Self-pay

## 2021-10-23 NOTE — Patient Outreach (Signed)
  Care Coordination   10/23/2021 Name: Doris White MRN: [id number] DOB: 10-12-1950   Care Coordination Outreach Attempts:  An unsuccessful telephone outreach was attempted today to offer the patient information about available care coordination services as a benefit of their health plan.   Follow Up Plan:  Additional outreach attempts will be made to offer the patient care coordination information and services.   Encounter Outcome:  No Answer  Care Coordination Interventions Activated:  No   Care Coordination Interventions:  No, not indicated    [place] [facility] [phone]

## 2021-10-24 ENCOUNTER — Ambulatory Visit: Payer: Self-pay

## 2021-10-24 NOTE — Patient Outreach (Signed)
  Care Coordination   10/24/2021 Name: Doris White MRN: [id number] DOB: [birth year]   Care Coordination Outreach Attempts:  An unsuccessful telephone outreach was attempted today to offer the patient information about available care coordination services as a benefit of their health plan.   Follow Up Plan:  Additional outreach attempts will be made to offer the patient care coordination information and services.   Encounter Outcome:  No Answer  Care Coordination Interventions Activated:  No   Care Coordination Interventions:  No, not indicated     [facility] [phone]

## 2021-11-16 ENCOUNTER — Telehealth: Payer: Self-pay

## 2021-11-16 NOTE — Patient Outreach (Signed)
  Care Coordination   11/16/2021 Name: Doris White MRN: [id number] DOB: Feb 09, 1951   Care Coordination Outreach Attempts:  A third unsuccessful outreach was attempted today to offer the patient with information about available care coordination services as a benefit of their health plan.   Follow Up Plan:  No further outreach attempts will be made at this time. We have been unable to contact the patient to offer or enroll patient in care coordination services  Encounter Outcome:  No Answer  Care Coordination Interventions Activated:  No   Care Coordination Interventions:  No, not indicated    [facility] [phone]

## 2021-11-19 DIAGNOSIS — I8310 Varicose veins of unspecified lower extremity with inflammation: Secondary | ICD-10-CM | POA: Diagnosis not present

## 2021-11-19 DIAGNOSIS — L089 Local infection of the skin and subcutaneous tissue, unspecified: Secondary | ICD-10-CM | POA: Diagnosis not present

## 2021-11-19 DIAGNOSIS — Z23 Encounter for immunization: Secondary | ICD-10-CM | POA: Diagnosis not present

## 2022-03-21 DIAGNOSIS — R5383 Other fatigue: Secondary | ICD-10-CM | POA: Diagnosis not present

## 2022-03-21 DIAGNOSIS — R42 Dizziness and giddiness: Secondary | ICD-10-CM | POA: Diagnosis not present
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
## 2022-10-29 DIAGNOSIS — H25811 Combined forms of age-related cataract, right eye: Secondary | ICD-10-CM | POA: Diagnosis not present

## 2022-10-29 DIAGNOSIS — H25812 Combined forms of age-related cataract, left eye: Secondary | ICD-10-CM | POA: Diagnosis not present

## 2022-11-04 DIAGNOSIS — I10 Essential (primary) hypertension: Secondary | ICD-10-CM | POA: Diagnosis not present

## 2022-11-04 DIAGNOSIS — E785 Hyperlipidemia, unspecified: Secondary | ICD-10-CM | POA: Diagnosis not present

## 2022-11-04 DIAGNOSIS — R5383 Other fatigue: Secondary | ICD-10-CM | POA: Diagnosis not present

## 2022-11-04 DIAGNOSIS — R7301 Impaired fasting glucose: Secondary | ICD-10-CM | POA: Diagnosis not present

## 2022-11-04 DIAGNOSIS — E039 Hypothyroidism, unspecified: Secondary | ICD-10-CM | POA: Diagnosis not present

## 2022-11-20 DIAGNOSIS — L089 Local infection of the skin and subcutaneous tissue, unspecified: Secondary | ICD-10-CM | POA: Diagnosis not present

## 2022-11-28 DIAGNOSIS — I10 Essential (primary) hypertension: Secondary | ICD-10-CM | POA: Diagnosis not present

## 2022-11-28 DIAGNOSIS — L089 Local infection of the skin and subcutaneous tissue, unspecified: Secondary | ICD-10-CM | POA: Diagnosis not present

## 2023-01-01 DIAGNOSIS — H25812 Combined forms of age-related cataract, left eye: Secondary | ICD-10-CM | POA: Diagnosis not present

## 2023-02-05 DIAGNOSIS — E039 Hypothyroidism, unspecified: Secondary | ICD-10-CM | POA: Diagnosis not present

## 2023-02-05 DIAGNOSIS — I10 Essential (primary) hypertension: Secondary | ICD-10-CM | POA: Diagnosis not present

## 2023-02-05 DIAGNOSIS — H2511 Age-related nuclear cataract, right eye: Secondary | ICD-10-CM | POA: Diagnosis not present

## 2023-02-05 DIAGNOSIS — H25811 Combined forms of age-related cataract, right eye: Secondary | ICD-10-CM | POA: Diagnosis not present

## 2023-02-21 DIAGNOSIS — Z23 Encounter for immunization: Secondary | ICD-10-CM | POA: Diagnosis not present

## 2023-02-21 DIAGNOSIS — Z0000 Encounter for general adult medical examination without abnormal findings: Secondary | ICD-10-CM | POA: Diagnosis not present

## 2023-02-21 DIAGNOSIS — R350 Frequency of micturition: Secondary | ICD-10-CM | POA: Diagnosis not present

## 2023-02-21 DIAGNOSIS — R3915 Urgency of urination: Secondary | ICD-10-CM | POA: Diagnosis not present

## 2023-07-23 DIAGNOSIS — R42 Dizziness and giddiness: Secondary | ICD-10-CM | POA: Diagnosis not present

## 2023-08-04 DIAGNOSIS — H6121 Impacted cerumen, right ear: Secondary | ICD-10-CM | POA: Diagnosis not present

## 2023-08-04 DIAGNOSIS — R42 Dizziness and giddiness: Secondary | ICD-10-CM | POA: Diagnosis not present

## 2023-08-19 DIAGNOSIS — H8112 Benign paroxysmal vertigo, left ear: Secondary | ICD-10-CM | POA: Diagnosis not present

## 2023-08-19 DIAGNOSIS — H8111 Benign paroxysmal vertigo, right ear: Secondary | ICD-10-CM | POA: Diagnosis not present

## 2023-08-19 DIAGNOSIS — R2689 Other abnormalities of gait and mobility: Secondary | ICD-10-CM | POA: Diagnosis not present

## 2023-08-19 DIAGNOSIS — R293 Abnormal posture: Secondary | ICD-10-CM | POA: Diagnosis not present

## 2023-08-19 DIAGNOSIS — M6281 Muscle weakness (generalized): Secondary | ICD-10-CM | POA: Diagnosis not present

## 2023-09-18 DIAGNOSIS — E785 Hyperlipidemia, unspecified: Secondary | ICD-10-CM | POA: Diagnosis not present

## 2023-09-18 DIAGNOSIS — E039 Hypothyroidism, unspecified: Secondary | ICD-10-CM | POA: Diagnosis not present

## 2023-09-18 DIAGNOSIS — R030 Elevated blood-pressure reading, without diagnosis of hypertension: Secondary | ICD-10-CM | POA: Diagnosis not present

## 2023-09-18 DIAGNOSIS — E559 Vitamin D deficiency, unspecified: Secondary | ICD-10-CM | POA: Diagnosis not present

## 2023-09-18 DIAGNOSIS — R42 Dizziness and giddiness: Secondary | ICD-10-CM | POA: Diagnosis not present

## 2023-09-18 DIAGNOSIS — R5383 Other fatigue: Secondary | ICD-10-CM | POA: Diagnosis not present

## 2023-09-18 DIAGNOSIS — Z131 Encounter for screening for diabetes mellitus: Secondary | ICD-10-CM | POA: Diagnosis not present

## 2023-09-21 DIAGNOSIS — Z789 Other specified health status: Secondary | ICD-10-CM | POA: Diagnosis not present

## 2023-09-21 DIAGNOSIS — Z1211 Encounter for screening for malignant neoplasm of colon: Secondary | ICD-10-CM | POA: Diagnosis not present

## 2023-09-21 DIAGNOSIS — R7303 Prediabetes: Secondary | ICD-10-CM | POA: Diagnosis not present

## 2023-09-21 DIAGNOSIS — E559 Vitamin D deficiency, unspecified: Secondary | ICD-10-CM | POA: Diagnosis not present

## 2023-09-21 DIAGNOSIS — E039 Hypothyroidism, unspecified: Secondary | ICD-10-CM | POA: Diagnosis not present

## 2023-09-21 DIAGNOSIS — Z1231 Encounter for screening mammogram for malignant neoplasm of breast: Secondary | ICD-10-CM | POA: Diagnosis not present

## 2023-09-21 DIAGNOSIS — R42 Dizziness and giddiness: Secondary | ICD-10-CM | POA: Diagnosis not present

## 2023-09-21 DIAGNOSIS — M8589 Other specified disorders of bone density and structure, multiple sites: Secondary | ICD-10-CM | POA: Diagnosis not present

## 2023-09-21 DIAGNOSIS — E785 Hyperlipidemia, unspecified: Secondary | ICD-10-CM | POA: Diagnosis not present

## 2023-09-29 DIAGNOSIS — Z1231 Encounter for screening mammogram for malignant neoplasm of breast: Secondary | ICD-10-CM | POA: Diagnosis not present

## 2023-09-29 DIAGNOSIS — R5383 Other fatigue: Secondary | ICD-10-CM | POA: Diagnosis not present

## 2023-09-29 DIAGNOSIS — E039 Hypothyroidism, unspecified: Secondary | ICD-10-CM | POA: Diagnosis not present

## 2023-09-29 DIAGNOSIS — Z789 Other specified health status: Secondary | ICD-10-CM | POA: Diagnosis not present

## 2023-09-29 DIAGNOSIS — Z1211 Encounter for screening for malignant neoplasm of colon: Secondary | ICD-10-CM | POA: Diagnosis not present

## 2023-09-29 DIAGNOSIS — E785 Hyperlipidemia, unspecified: Secondary | ICD-10-CM | POA: Diagnosis not present

## 2023-09-29 DIAGNOSIS — E559 Vitamin D deficiency, unspecified: Secondary | ICD-10-CM | POA: Diagnosis not present

## 2023-09-29 DIAGNOSIS — R7303 Prediabetes: Secondary | ICD-10-CM | POA: Diagnosis not present

## 2023-09-29 DIAGNOSIS — R42 Dizziness and giddiness: Secondary | ICD-10-CM | POA: Diagnosis not present

## 2023-09-29 DIAGNOSIS — M8589 Other specified disorders of bone density and structure, multiple sites: Secondary | ICD-10-CM | POA: Diagnosis not present
